# Patient Record
Sex: Male | Born: 1975 | Race: White | Hispanic: No | Marital: Married | Smoking: Current every day smoker
Health system: Southern US, Community
[De-identification: ages and names within clinical notes are randomized; demographics above are authoritative.]

## PROBLEM LIST (undated history)

## (undated) DIAGNOSIS — K469 Unspecified abdominal hernia without obstruction or gangrene: Secondary | ICD-10-CM

## (undated) DIAGNOSIS — Z86718 Personal history of other venous thrombosis and embolism: Secondary | ICD-10-CM

## (undated) DIAGNOSIS — Z8719 Personal history of other diseases of the digestive system: Secondary | ICD-10-CM

## (undated) DIAGNOSIS — E785 Hyperlipidemia, unspecified: Secondary | ICD-10-CM

## (undated) HISTORY — PX: MOUTH SURGERY: SHX715

## (undated) HISTORY — DX: Hyperlipidemia, unspecified: E78.5

## (undated) HISTORY — PX: HERNIA REPAIR: SHX51

---

## 2005-06-04 ENCOUNTER — Emergency Department (HOSPITAL_COMMUNITY): Admission: EM | Admit: 2005-06-04 | Discharge: 2005-06-04 | Payer: Self-pay | Admitting: Emergency Medicine

## 2008-02-01 ENCOUNTER — Emergency Department (HOSPITAL_COMMUNITY): Admission: EM | Admit: 2008-02-01 | Discharge: 2008-02-01 | Payer: Self-pay | Admitting: Family Medicine

## 2008-02-25 ENCOUNTER — Emergency Department (HOSPITAL_COMMUNITY): Admission: EM | Admit: 2008-02-25 | Discharge: 2008-02-25 | Payer: Self-pay | Admitting: Family Medicine

## 2008-03-03 ENCOUNTER — Emergency Department (HOSPITAL_COMMUNITY): Admission: EM | Admit: 2008-03-03 | Discharge: 2008-03-03 | Payer: Self-pay | Admitting: Emergency Medicine

## 2008-04-03 ENCOUNTER — Encounter: Admission: RE | Admit: 2008-04-03 | Discharge: 2008-04-03 | Payer: Self-pay | Admitting: Dentistry

## 2008-04-03 ENCOUNTER — Ambulatory Visit: Payer: Self-pay | Admitting: Dentistry

## 2008-04-06 ENCOUNTER — Ambulatory Visit (HOSPITAL_COMMUNITY): Admission: RE | Admit: 2008-04-06 | Discharge: 2008-04-06 | Payer: Self-pay | Admitting: Surgery

## 2008-04-13 ENCOUNTER — Ambulatory Visit (HOSPITAL_COMMUNITY): Admission: RE | Admit: 2008-04-13 | Discharge: 2008-04-13 | Payer: Self-pay | Admitting: Dentistry

## 2008-05-13 ENCOUNTER — Ambulatory Visit: Payer: Self-pay | Admitting: Pediatrics

## 2008-05-16 ENCOUNTER — Ambulatory Visit: Payer: Self-pay | Admitting: Dentistry

## 2008-07-11 ENCOUNTER — Ambulatory Visit: Payer: Self-pay | Admitting: Dentistry

## 2008-07-26 ENCOUNTER — Encounter: Admission: RE | Admit: 2008-07-26 | Discharge: 2008-07-26 | Payer: Self-pay | Admitting: Surgery

## 2008-11-19 ENCOUNTER — Emergency Department (HOSPITAL_COMMUNITY): Admission: EM | Admit: 2008-11-19 | Discharge: 2008-11-19 | Payer: Self-pay | Admitting: Emergency Medicine

## 2009-01-10 ENCOUNTER — Ambulatory Visit: Payer: Self-pay | Admitting: Dentistry

## 2009-08-07 ENCOUNTER — Ambulatory Visit: Payer: Self-pay | Admitting: Dentistry

## 2010-08-07 ENCOUNTER — Ambulatory Visit: Payer: Self-pay | Admitting: Dentistry

## 2010-10-28 ENCOUNTER — Emergency Department (HOSPITAL_COMMUNITY)
Admission: EM | Admit: 2010-10-28 | Discharge: 2010-10-28 | Payer: Self-pay | Source: Home / Self Care | Admitting: Emergency Medicine

## 2011-04-29 NOTE — Op Note (Signed)
NAME:  Zachary Rodriguez, Zachary Rodriguez                ACCOUNT NO.:  [id number]   MEDICAL RECORD NO.:  [id number]          PATIENT TYPE:  AMB   LOCATION:  DAY                          FACILITY:  [facility]   PHYSICIAN:  Wilmon Arms. Corliss Skains, M.D. DATE OF BIRTH:  03-06-1976   DATE OF PROCEDURE:  04/06/2008  DATE OF DISCHARGE:                               OPERATIVE REPORT   PREOPERATIVE DIAGNOSIS:  Tiny epigastric hernia.   POSTOPERATIVE DIAGNOSIS:  Tiny epigastric hernia.   PROCEDURE:  Epigastric hernia repair.   SURGEON:  Wilmon Arms. Corliss Skains, MD, FACS   ANESTHESIA:  General.   INDICATIONS:  The patient is a 35 year old male who presents with severe  epigastric pain with a palpable bulge.  This area is fairly tender.  The  patient also has problems with very poor dentition with multiple dental  abscesses.  He is scheduled for full dental extractions next week by Dr.  Kristin Bruins.  However, due to the severity of his pain, we made the  decision to proceed with the repair of this hernia today.  I felt that  he likely had a small amount of his falciform ligament which had  herniated up through a small defect.  We will not use mesh in light of  his dental condition.   DESCRIPTION OF PROCEDURE:  The patient was brought to the operating  room, placed in a supine position on the operating room table.  The  palpable bulge had previously been marked while the patient was awake.  After an adequate level of general anesthesia was obtained, his chest  and abdomen were prepped with Betadine and draped in a sterile fashion.  A time-out was taken to assure the proper patient, proper procedure.  We  palpated the area in question.  This seemed to be just below his xiphoid  process.  His xiphoid was quite prominent.  With the patient asleep, I  could not palpate a hernia bulge.  We made a vertical 2.5 cm incision  over the area that had previously been marked.  Dissection was carried  down to the fascia.  The fascia was opened  anteriorly.  We dissected  down and entered the peritoneal cavity sharply.  I inserted my finger  and swept the falciform ligament away from the anterior abdominal wall.  No other fascial defects were noted.  I was able to palpate down a  couple of inches towards his umbilicus, and no other midline defects  were noted.  Superiorly, his xiphoid process was easily palpated.  No  fractures were noted.  However, his xiphoid process was extremely  prominent.  We then infiltrated the tissue around the xiphoid process  with 0.25% Marcaine with epinephrine.  The fascia was then  reapproximated with figure-of-eight 0 Prolene sutures.  Vicryl 3-0 was  used to close the subcutaneous tissues over the Prolene sutures.  Monocryl 4-0 was used to close the skin incision.  Steri-Strips and  clean dressings were applied.  The patient was then extubated and  brought to recovery in stable condition.  All sponge, instrument, and  needle counts were correct.  Wilmon Arms. Tsuei, M.D.  Electronically Signed    MKT/MEDQ  D:  04/06/2008  T:  04/06/2008  Job:  [id number]

## 2011-04-29 NOTE — Consult Note (Signed)
NAME:  Zachary Rodriguez, Zachary Rodriguez                ACCOUNT NO.:  [id number]   MEDICAL RECORD NO.:  [id number]          PATIENT TYPE:  AMB   LOCATION:  DAY                          FACILITY:  [facility]   PHYSICIAN:  Charlynne Pander, D.D.S.DATE OF BIRTH:  [birth year]   DATE OF CONSULTATION:  04/03/2008  DATE OF DISCHARGE:                                 CONSULTATION   /REFERRING PHYSICIAN/>  Wilmon Arms. Tsuei, M.D.   [name]. Bagnall is a 35 year old male referred by Dr. [name] for  dental consultation.  Patient with history of abdominal pain and hernia  with anticipated hernia repair surgery with Dr. Corliss Skains scheduled later  this week.  Dental consultation was requested to rule out dental  infection that may affect the patient's systemic health.   MEDICAL HISTORY:  1. History of abdominal pain with hernia with anticipated surgery with      Dr. Corliss Skains on April 06, 2008.  2. History of hay fever/seasonal allergies.  3. Visual deficit - wears glasses.  4. History of severe dental phobia.   ALLERGIES:  None known.   MEDICATIONS:  1. Pepcid 10 mg daily.  2. Vicodin 5/500 one to two every 4 hours as needed for pain.  3. Boost supplement drinks 2 per day.   SOCIAL HISTORY:  The patient is married with 2 children.  Patient with a  history of smoking 1/2 to 1 pack per day for 17 years.  Patient with  rare use of alcohol.  The patient has no other illicit drug use.   FAMILY HISTORY:  Father had dentures at age of 22.  All other history is  noncontributory.   FUNCTIONAL ASSESSMENT:  The patient remains independent for ADLs.   REVIEW OF SYSTEMS:  This is reviewed with the patient and is included in  the dental consultation record.   DENTAL HISTORY:   CHIEF COMPLAINT:  Dental consultation requested to rule out dental  infection which may affect the patient's systemic health.   HISTORY OF PRESENT ILLNESS:  The patient was recently evaluated by Dr.  [name] for hernia repair surgery.  Poor  dentition noted at that  time.  Dental consultation was requested to rule out dental infection  that may affect the patient's systemic health.   The patient currently denies toothaches at this time although has a  history of intermittent tooth pain.  The patient points to multiple  teeth on the upper and lower arch as being involved.  The patient  indicates he last saw a dentist (Dr. [name]) for evaluation  for treatment options on October 14, 2007.  The patient was given  treatment plan for multiple extractions of remaining teeth and  subsequent upper and lower complete denture fabrication.  The patient  could not afford dental treatment at that time and has not been able to  follow up with this dental treatment.   The patient does give a history of severe dental phobia from the age of  38 when a cavity was restored without having sufficient local anesthesia  obtained.  The patient has only had three  other fillings since then in  his teenage years.   DENTAL EXAMINATION:  GENERAL:  The patient is well-developed, slightly  built male in no acute distress.  VITAL SIGNS:  Blood pressure is 120/58, pulse is equal to 68,  temperature is 98.2.  HEAD AND NECK:  There is no palpable lymphadenopathy noted at this time.  The patient denies acute TMJ symptoms.  INTRAORAL EXAM:  The patient has normal saliva.  The patient has a small  palatal torus near the vibrating line.  The patient also has bilateral  mandibular lingual tori.  The patient also has an exostoses involving  the upper right, upper left, lower left, and lower right quadrants near  the third molars.  There are no obvious abscesses noted at this time.  DENTITION:  The patient is not missing any teeth.  The patient, however,  has multiple retained root segments.  PERIODONTAL:  Patient with chronic advanced periodontal disease with  plaque and calculus accumulations, generalized gingival recession,  generalized tooth  mobility, and moderate to severe bone loss.  DENTAL CARIES:  The patient has rampant dental caries affecting all  teeth.  ENDODONTIC:  The patient with a history of previous toothache symptoms.  The patient denies toothaches today.  The patient does have multiple  teeth with periapical pathology.  CROWN OR BRIDGE:  There are no crown or bridge restorations.  PROSTHODONTIC:  The patient has no dentures but is interested in upper  and lower complete dentures without implants at this time.  OCCLUSION:  Patient with a poor occlusal scheme secondary to multiple  retained root segments, rampant dental caries, and supereruption and  drifting of the unopposed teeth into the edentulous areas.   RADIOGRAPHIC INTERPRETATION:  A panoramic x-ray was obtained today.   There are multiple areas of periapical pathology.  There are multiple  retained root segments.  There are rampant dental caries affecting all  remaining teeth.  There is a poor occlusal scheme noted.   ASSESSMENT:  1. History of dental phobia and reason for dental neglect.  2. History of acute pulpitis symptoms.  3. Multiple areas of periapical pathology.  4. Rampant dental caries.  5. Palatal torus.  6. Bilateral mandibular lingual tori.  7. Lateral exostoses involving all four quadrants of the mouth.  8. Poor occlusal scheme.  9. Significant risk for dental infection being able to affect the      patient's systemic health if left untreated.   PLAN/RECOMMENDATIONS:  1. I have discussed risks, benefits, and complications of various      treatment options with the patient in relationship to his medical      and dental conditions and anticipated hernia surgery.  We discussed      various treatment options to include no treatment, total and      subtotal extractions with alveoplasty, preprosthetic surgery as      indicated, periodontal therapy, dental restorations, crown or      bridge therapy, root canal therapy, implant therapy,  and replacing      missing teeth as indicated after adequate healing.  We discussed      timing of the dental surgery in relationship to his hernia repair      surgery.  The patient currently is interested in proceeding with      extraction of all remaining teeth with alveoloplasty and a      preprosthetic surgery approximately one week after the hernia      repair surgery on April 06, 2008.  The dental surgery is      tentatively scheduled for April 13, 2008 at [facility] at      7:30 in the morning pending any complications from the hernia      repair surgery with Dr. Corliss Skains.  The patient will then have adequate      time for healing and proceed with upper and lower complete denture      fabrication with a dentist of his choice.  The patient currently      again is not interested in implant therapy at this time but may be      interested in the future.  2. Discussion of findings with Dr. [name] and coordination of      future dental surgery on April 13, 2008 as indicated.      Charlynne Pander, D.D.S.  Electronically Signed     RFK/MEDQ  D:  04/03/2008  T:  04/03/2008  Job:  [id number]   cc:   Wilmon Arms. Corliss Skains, M.D.  [address] [place] [address]  [facility]   Charlynne Pander, D.D.S.  Fax: [phone]

## 2011-04-29 NOTE — Op Note (Signed)
NAME:  Zachary Rodriguez, Zachary Rodriguez                ACCOUNT NO.:  [id number]   MEDICAL RECORD NO.:  [id number]          PATIENT TYPE:  AMB   LOCATION:  DAY                          FACILITY:  [facility]   PHYSICIAN:  Charlynne Pander, D.D.S.DATE OF BIRTH:  08/02/1976   DATE OF PROCEDURE:  04/13/2008  DATE OF DISCHARGE:                               OPERATIVE REPORT   PREOPERATIVE DIAGNOSES:  1. History of abdominal pain - status post hernia repair surgery with      Dr. [name].  2. Dental phobia.  3. Chronic apical periodontitis.  4. Chronic periodontitis.  5. Multiple retained root segments.  6. Bilateral mandibular lingual tori.  7. Lateral exostoses involving all four quadrants.   POSTOPERATIVE DIAGNOSES:  1. History of abdominal pain - status post hernia repair surgery with      Dr. [name].  2. Dental phobia.  3. Chronic apical periodontitis.  4. Chronic periodontitis.  5. Multiple retained root segments.  6. Bilateral mandibular lingual tori.  7. Lateral exostoses involving all four quadrants.   OPERATIONS:  1. Extraction of remaining teeth (teeth numbers 1, 2, 3, 4, 5, 6, 7,      8, 9, 10, 11, 12, 13, 14, 15, 16, 17, 18, 19, 20, 21, 22, 23, 24,      25, 26, 27, 28, 29, 30, 31 and 32).  2. Four quadrants alveoloplasty.  3. Lateral exostosis reductions in the upper right, upper left, lower      left, lower right quadrants.  4. Bilateral mandibular lingual tori reductions.   SURGEON:  Charlynne Pander, D.D.S.   ASSISTANT:  Public house manager (Sales executive).   ANESTHESIA:  General anesthesia via nasoendotracheal tube.   MEDICATIONS:  1. Ancef 1 gram IV prior to invasive dental procedures.  2. Local anesthesia with a total utilization of five carpules each      containing 36 mg of Xylocaine with 0.018 mg of epinephrine as well      as two carpules each containing 9 mg of bupivacaine with 0.009 mg      of epinephrine.   SPECIMENS:  There were 32 teeth that were  discarded.   CULTURES:  Were none.   DRAINS:  Were none.   ESTIMATED BLOOD LOSS:  Was 150 mL.   FLUIDS:  Were 1400 mL lactated Ringer's solution.   COMPLICATIONS:  There was a soft tissue tear in the lower right lingual  vestibule.  This was closed with two separate 4-0 chromic interrupted  sutures.   INDICATIONS:  The patient was recently diagnosed with abdominal pain and  needed a hernia repair surgery.  This was performed by Dr. [name]  on April 06, 2008.  Dental consultation was requested by Dr. Corliss Skains to  rule out dental infection which may affect the patient's systemic  health.  The patient was examined and treatment planned for extraction  of all remaining teeth with alveoloplasty and pre-prosthetic surgery as  indicated.  This treatment plan was formulated to decrease the risk and  complications associated with dental infection from affecting the  patient's systemic health.  OPERATIVE FINDINGS:  The patient was examined in operating room #6.  The  teeth were identified for extraction.  The patient was noted to be  affected by chronic periodontitis, chronic apical periodontitis,  multiple retained root segments, dental caries, lateral exostoses and  bilateral mandibular lingual tori.  The aforementioned necessitated the  removal of all remaining teeth with alveoloplasty and pre prosthetic  surgery as indicated..   DESCRIPTION OF PROCEDURE:  The patient was brought to the main operating  room #6.  The patient was then placed in the supine position on the  operating room table.  The patient was then intubated utilizing a  nasoendotracheal tube.  The patient was then prepped and draped in the  usual manner for a dental medicine procedure.  A time-out was performed  and the patient was identified and procedures verified.  A throat pack  was placed at this time.  The oral cavity was then thoroughly examined  with the findings as noted above.  The patient was then ready  for the  dental medicine procedure as follows:   Local anesthesia was administered sequentially with a total utilization  of five carpules each containing 36 mg of Xylocaine with 0.018 mg of  epinephrine as well as two carpules each containing 9 mg of bupivacaine  with 0.009 mg of epinephrine.  This local anesthesia was administered  sequentially over the 3 hour long procedure.   The maxillary quadrants were first approached.  Anesthesia was delivered  with infiltration utilizing the Xylocaine with epinephrine.  A 15 blade  incision was then made from the maxillary right tuberosity and extended  to the maxillary left tuberosity.  A surgical flap was then reflected  appropriately.  Appropriate amounts of buccal and interseptal bone was  removed at this time utilizing a surgical handpiece and bur and copious  amounts of sterile saline.  The teeth were then subluxated with a series  of straight elevators.  Tooth number 1 was then removed with a 150  forceps without complications.  Tooth numbers 2 and 3 were then removed  with a 53R forceps without complications.  Tooth numbers 4, 5, 6, 7, 8,  9, 10, 11, 12 and 13 were then removed with a 150 forceps without  complications.  Tooth numbers 14 and 15 were then removed with a 53L  forceps without complications.  Tooth number 16 was then removed with  rongeurs appropriately.  At this point in time alveoloplasty was  performed utilizing rongeurs and bone file.  The buccal exostoses in the  upper right and upper left quadrants were then visualized and removed  with a surgical handpiece and bur and copious amounts of sterile saline.  The surgical sites were then irrigated with copious amounts of sterile  saline.  The tissues were approximated and trimmed appropriately.  Further alveoplasty was then performed utilizing rongeurs and bone file.  The surgical sites were then irrigated with copious amounts of sterile  saline x2.  At this point in time  the maxillary right surgical site was  closed from the distal of the tuberosity and extended to the mesial of  number 8 utilizing 3-0 chromic gut suture in a continuous interrupted  suture technique x1.  The maxillary left quadrant was then closed from  the distal of the tuberosity and extended to the mesial of number 9  utilizing 3-0 chromic gut suture in a continuous interrupted suture  technique x1.   At this point in time the mandibular quadrants  were approached.  The  patient was given bilateral inferior alveolar nerve blocks utilizing the  bupivacaine with epinephrine.  Further infiltration was then achieved  utilizing Xylocaine with epinephrine.  A 15 blade incision was then made  from the distal of number 32 and extended to the mesial of number 22.  Surgical flap was then carefully reflected.  Appropriate amounts of  buccal and interseptal bone were removed around tooth numbers 27, 28,  29, 30 and 31 appropriately.  The lower teeth were then subluxated with  a series of straight elevators.  Tooth numbers 30, 31 and 32 were then  removed with a 17 forceps without complications.  Tooth numbers 23, 24,  25, 26, 27, 28 and 29 were then removed with a 151 forceps without  further complications.  Alveoloplasty was then performed utilizing  rongeurs and bone file.  At this point in time the mandibular lingual  tori were visualized and removed with a surgical handpiece and bur and  copious amounts of sterile saline.  The flap was further reflected in  the area distal to tooth number 32 and lateral exostoses were noted and  then removed with a surgical handpiece and bur and copious amounts of  sterile saline.  Alveoplasty was then again performed utilizing rongeurs  and bone file appropriately.  The surgical sites were then irrigated  with copious amounts of sterile saline x2.  Tissues were approximated  and trimmed appropriately.  The surgical site was then closed from the  distal of  number 32 and extended to the mesial of number 25 utilizing 3-  0 chromic gut suture in a continuous interrupted suture technique x1.   At this point in time the mandibular left quadrant was approached.  A 15  blade incision was then made from distal of number 17 and extended to  the mesial of number 22.  Surgical flap was then further reflected.  Appropriate amounts of buccal and interseptal bone were removed with  surgical handpiece and bur and copious amounts of sterile saline around  tooth numbers 17, 18, 19, 20, 21 and 22.  These teeth were then  subluxated with a series of straight elevators.  Tooth number 17 was  then removed with a 17 forceps without complications.  Tooth number 18  was removed with a 151 forceps.  Tooth number 19 was then removed with a  23 forceps without further complications.  Tooth numbers 20, 21 and 22  were then removed with a 151 forceps without complications.  At this  point in time alveoloplasty was performed utilizing rongeurs and bone  file.  The flap was then further reflected on the lingual aspect to  expose the mandibular tori.  These were then removed with a surgical  handpiece and bur and copious amounts of sterile saline.  The flap was  then further reflected distal of tooth number 17 to expose the excess  lateral exostoses in this area.  These exostoses were then removed with  a surgical handpiece and bur and copious amounts of sterile saline.  Further alveoplasty was then performed utilizing rongeurs and bone file.  The surgical site was then irrigated with copious amounts of sterile  saline x2.  The surgical site was then closed from the distal of number  17 and extended to the mesial of number 24 utilizing 3-0 chromic gut  suture in a continuous interrupted suture technique x1.  At this point  in time the entire mouth was irrigated with copious amounts  of sterile  saline.  A soft tissue tear was noted on the lingual vestibule in the  area of  teeth numbers 20 and 29.  This 8 mm soft tissue tear was then  closed utilizing two separate interrupted sutures and 4-0 chromic gut  material.  The patient was then examined and no further complications  were noted.   At this point in time the throat pack was removed.  A series of 4x4  gauzes were placed in the mouth to aid in hemostasis.  The patient was  then handed over to the anesthesia team for final disposition.  After  appropriate amount of time the patient was extubated and taken to the  post anesthesia care unit with stable vital signs and a good oxygenation  level.  All counts were correct for the dental medicine procedure.  The  patient will be given appropriate pain medication utilizing Percocet  5/325 taking 1-2 tablets as needed every 4-6 hours.  The patient is  aware of the  need to avoid driving while on this medication as well as to avoid  alcohol.  The patient also will be given a prescription for amoxicillin  500 mg taking 1 capsule every eight hours for the next 7 days.  The  patient is to contact dental medicine if acute problems arise as a  postop complication.      Charlynne Pander, D.D.S.  Electronically Signed     RFK/MEDQ  D:  04/13/2008  T:  04/13/2008  Job:  [id number]   cc:   Wilmon Arms. Corliss Skains, M.D.  [address] [place] [address] [id number]  [place]

## 2011-08-13 ENCOUNTER — Encounter (HOSPITAL_COMMUNITY): Payer: Self-pay | Admitting: Dentistry

## 2011-08-13 DIAGNOSIS — K08109 Complete loss of teeth, unspecified cause, unspecified class: Secondary | ICD-10-CM

## 2011-08-13 DIAGNOSIS — F40298 Other specified phobia: Secondary | ICD-10-CM

## 2011-09-05 LAB — I-STAT 8, (EC8 V) (CONVERTED LAB)
Bicarbonate: 25.2 — ABNORMAL HIGH
HCT: 58 — ABNORMAL HIGH
Hemoglobin: 19.7 — ABNORMAL HIGH
Operator id: 116391
Potassium: 4.9
Sodium: 138
TCO2: 26

## 2011-09-05 LAB — STOOL CULTURE

## 2011-09-08 LAB — URINALYSIS, ROUTINE W REFLEX MICROSCOPIC
Glucose, UA: NEGATIVE
Protein, ur: NEGATIVE
Specific Gravity, Urine: 1.021

## 2011-09-08 LAB — COMPREHENSIVE METABOLIC PANEL
Alkaline Phosphatase: 91
BUN: 15
Calcium: 9.9
Creatinine, Ser: 1.05
Glucose, Bld: 101 — ABNORMAL HIGH
Total Protein: 7.7

## 2011-09-08 LAB — DIFFERENTIAL
Basophils Relative: 1
Lymphocytes Relative: 39
Lymphs Abs: 2.9
Monocytes Relative: 11
Neutro Abs: 3.3
Neutrophils Relative %: 45

## 2011-09-08 LAB — CBC
HCT: 52.1 — ABNORMAL HIGH
Hemoglobin: 17.4 — ABNORMAL HIGH
MCHC: 33.4
MCV: 82.8
Platelets: 198
RDW: 13.8

## 2011-09-09 LAB — BASIC METABOLIC PANEL WITH GFR
BUN: 14
CO2: 26
Calcium: 9.9
Chloride: 104
Creatinine, Ser: 0.89
GFR calc non Af Amer: >60
Glucose, Bld: 98
Potassium: 3.9
Sodium: 138

## 2011-09-09 LAB — PROTIME-INR
INR: 1
Prothrombin Time: 13

## 2011-09-09 LAB — CBC
RBC: 6.22 — ABNORMAL HIGH
WBC: 8.6

## 2011-09-09 LAB — APTT: aPTT: 29

## 2011-09-19 LAB — POCT CARDIAC MARKERS
CKMB, poc: <1 ng/mL — ABNORMAL LOW (ref 1.0–8.0)
Troponin i, poc: <0.05 ng/mL (ref 0.00–0.09)

## 2011-09-19 LAB — DIFFERENTIAL
Basophils Absolute: 0 10*3/uL (ref 0.0–0.1)
Lymphocytes Relative: 19 % (ref 12–46)
Monocytes Absolute: 0.6 10*3/uL (ref 0.1–1.0)
Monocytes Relative: 6 % (ref 3–12)
Neutro Abs: 7.4 10*3/uL (ref 1.7–7.7)

## 2011-09-19 LAB — POCT I-STAT, CHEM 8
HCT: 50 % (ref 39.0–52.0)
Hemoglobin: 17 g/dL (ref 13.0–17.0)
Potassium: 3.3 mEq/L — ABNORMAL LOW (ref 3.5–5.1)
Sodium: 143 mEq/L (ref 135–145)

## 2011-09-19 LAB — CBC
Hemoglobin: 16.2 g/dL (ref 13.0–17.0)
RBC: 5.72 MIL/uL (ref 4.22–5.81)

## 2012-04-18 ENCOUNTER — Encounter (HOSPITAL_COMMUNITY): Payer: Self-pay

## 2012-04-18 ENCOUNTER — Emergency Department (HOSPITAL_COMMUNITY)
Admission: EM | Admit: 2012-04-18 | Discharge: 2012-04-19 | Disposition: A | Discharge status: Home / Self Care | Payer: Self-pay | Attending: Emergency Medicine | Admitting: Emergency Medicine

## 2012-04-18 DIAGNOSIS — F17200 Nicotine dependence, unspecified, uncomplicated: Secondary | ICD-10-CM | POA: Insufficient documentation

## 2012-04-18 DIAGNOSIS — R109 Unspecified abdominal pain: Secondary | ICD-10-CM | POA: Insufficient documentation

## 2012-04-18 DIAGNOSIS — R112 Nausea with vomiting, unspecified: Secondary | ICD-10-CM | POA: Insufficient documentation

## 2012-04-18 DIAGNOSIS — R197 Diarrhea, unspecified: Secondary | ICD-10-CM | POA: Insufficient documentation

## 2012-04-18 MED ORDER — DIPHENOXYLATE-ATROPINE 2.5-0.025 MG PO TABS
2.0000 | ORAL_TABLET | Freq: Once | ORAL | Status: DC
  Filled 2012-04-18: qty 2

## 2012-04-18 MED ORDER — ONDANSETRON HCL 4 MG/2ML IJ SOLN
4.0000 mg | Freq: Once | INTRAMUSCULAR | Status: AC
  Administered 2012-04-18: 4 mg via INTRAVENOUS
  Filled 2012-04-18: qty 2

## 2012-04-18 MED ORDER — MORPHINE SULFATE 2 MG/ML IJ SOLN
2.0000 mg | Freq: Once | INTRAMUSCULAR | Status: AC
  Administered 2012-04-18: 2 mg via INTRAVENOUS
  Filled 2012-04-18: qty 1

## 2012-04-18 MED ORDER — SODIUM CHLORIDE 0.9 % IV SOLN
Freq: Once | INTRAVENOUS | Status: AC
  Administered 2012-04-18: via INTRAVENOUS

## 2012-04-18 MED ORDER — SODIUM CHLORIDE 0.9 % IV BOLUS (SEPSIS)
1000.0000 mL | Freq: Once | INTRAVENOUS | Status: AC
  Administered 2012-04-18: 1000 mL via INTRAVENOUS

## 2012-04-18 NOTE — ED Provider Notes (Signed)
History     CSN: [id number]  Arrival date & time 04/18/12  2337   First MD Initiated Contact with Patient 04/18/12 2339      Chief Complaint  Patient presents with  . Abdominal Pain  . Emesis    (Consider location/radiation/quality/duration/timing/severity/associated sxs/prior treatment) HPI Zachary Rodriguez is a 36 y.o. male who presents to the Emergency Department complaining of sudden onset  nausea, vomiting, dairrhea and abdominal cramping and pain that began after eating tacos tonight. No one else in the family is having similar symptoms. Denies fever, chills.     History reviewed. No pertinent past medical history.  History reviewed. No pertinent past surgical history.  No family history on file.  History  Substance Use Topics  . Smoking status: Current Everyday Smoker  . Smokeless tobacco: Not on file  . Alcohol Use: No      Review of Systems  Constitutional: Negative for fever.       10 Systems reviewed and are negative for acute change except as noted in the HPI.  HENT: Negative for congestion.   Eyes: Negative for discharge and redness.  Respiratory: Negative for cough and shortness of breath.   Cardiovascular: Negative for chest pain.  Gastrointestinal: Positive for nausea, vomiting, abdominal pain and diarrhea.  Musculoskeletal: Negative for back pain.  Skin: Negative for rash.  Neurological: Negative for syncope, numbness and headaches.  Psychiatric/Behavioral:       No behavior change.    Allergies  Review of patient's allergies indicates no known allergies.  Home Medications  No current outpatient prescriptions on file.  BP 174/98  Pulse 76  Temp(Src) 98.1 F (36.7 C) (Oral)  Resp 20  Ht 5\' 9"  (1.753 m)  Wt 115 lb (52.164 kg)  BMI 16.98 kg/m2  SpO2 100%  Physical Exam  Nursing note and vitals reviewed. Constitutional:       Awake, alert, nontoxic appearance.  HENT:  Head: Atraumatic.  Eyes: Right eye exhibits no discharge. Left eye  exhibits no discharge.  Neck: Neck supple.  Pulmonary/Chest: Effort normal. He exhibits no tenderness.  Abdominal: Soft. There is tenderness. There is no rebound and no guarding.       Bowel sounds hyperactive  Musculoskeletal: He exhibits no tenderness.       Baseline ROM, no obvious new focal weakness.  Neurological:       Mental status and motor strength appears baseline for patient and situation.  Skin: No rash noted.  Psychiatric: He has a normal mood and affect.    ED Course  Procedures (including critical care time) Dg Abd Acute W/chest  04/19/2012  *RADIOLOGY REPORT*  Clinical Data: Severe right-sided abdominal pain, nausea and vomiting.  ACUTE ABDOMEN SERIES (ABDOMEN 2 VIEW & CHEST 1 VIEW)  Comparison: CT of the abdomen and pelvis performed 07/26/2008, and chest radiograph performed 11/19/2008  Findings: The lungs are well-aerated.  Mild peribronchial thickening is noted.  There is no evidence of focal opacification, pleural effusion or pneumothorax.  The cardiomediastinal silhouette is within normal limits.  Increased density of the right hemithorax is thought to reflect overlying soft tissues.  The liver appears mildly enlarged, measuring 21.9 cm in length; this appears mildly more prominent than in 2009.  The visualized bowel gas pattern is unremarkable.  Mild scattered stool and air are seen within the colon; there is no evidence of small bowel dilatation to suggest obstruction.  No free intra- abdominal air is identified on the provided upright view.  No acute osseous  abnormalities are seen; the sacroiliac joints are unremarkable in appearance.  IMPRESSION:  1.  Unremarkable bowel gas pattern; no free intra-abdominal air seen. 2.  No acute cardiopulmonary process identified. 3.  Apparent mild hepatomegaly.  Original Report Authenticated By: Tonia Ghent, M.D.      MDM  Patient with nausea vomiting diarrhea illness that began after eating is tonight. Given IV fluids, antiemetic and  analgesic, and antidiarrheal with improvement. Able to take by mouth fluids. Formed stool while in the ER.Pt stable in ED with no significant deterioration in condition.The patient appears reasonably screened and/or stabilized for discharge and I doubt any other medical condition or other [facility] requiring further screening, evaluation, or treatment in the ED at this time prior to discharge.  MDM Reviewed: nursing note and vitals Interpretation: x-ray          Nicoletta Dress. Colon Branch, MD 04/19/12 4098

## 2012-04-18 NOTE — ED Notes (Signed)
abd pain, vomiting and diarrhea onset after eating taco's tonight, states has had intermittent abd pain since hernia repair surgery several years ago

## 2012-04-19 ENCOUNTER — Emergency Department (HOSPITAL_COMMUNITY): Payer: Self-pay

## 2012-04-19 MED ORDER — PROMETHAZINE HCL 25 MG/ML IJ SOLN
12.5000 mg | Freq: Once | INTRAMUSCULAR | Status: AC
  Administered 2012-04-19: 12.5 mg via INTRAVENOUS
  Filled 2012-04-19: qty 1

## 2012-04-19 MED ORDER — PROMETHAZINE HCL 25 MG PO TABS: 12.5000 mg | ORAL_TABLET | Freq: Four times a day (QID) | ORAL | Status: DC | PRN

## 2012-04-19 MED ORDER — MORPHINE SULFATE 2 MG/ML IJ SOLN
2.0000 mg | Freq: Once | INTRAMUSCULAR | Status: AC
  Administered 2012-04-19: 2 mg via INTRAVENOUS
  Filled 2012-04-19: qty 1

## 2012-04-19 NOTE — Discharge Instructions (Signed)
Your x-rays tonight did not show any blockage in the intestines.Bland diet for the next 8-10 hours then progress as you can tolerate. Use the nausea medicine as needed.Follow up with your doctor.  Abdominal Pain Many things can cause belly (abdominal) pain. Most times, the belly pain is not dangerous. The amount of belly pain does not tell how serious the problem may be. Many cases of belly pain can be watched and treated at home. HOME CARE   Do not take medicines that help you go poop (laxatives) unless told to by your doctor.   Only take medicine as told by your doctor.   Eat or drink as told by your doctor. Your doctor will tell you if you should be on a special diet.  GET HELP RIGHT AWAY IF:   The pain does not go away.   You have a fever.   You keep throwing up (vomiting).   The pain changes and is only in the right or left part of the belly.   You have bloody or tarry looking poop.  MAKE SURE YOU:   Understand these instructions.   Will watch your condition.   Will get help right away if you are not doing well or get worse.  Document Released: 05/19/2008 Document Revised: 11/20/2011 Document Reviewed: 12/17/2009 [facility] Patient Information 2012 [place], [place].

## 2012-04-19 NOTE — ED Notes (Signed)
Notified dr. Colon Branch that patient is requesting more pain medication.

## 2012-04-19 NOTE — ED Notes (Signed)
Was removing patient's iv by rubbing alcohol on tape prior to pulling so tape wouldn't pull hair and skin as hard. Patient stated to go ahead and pull tape off fast so it wouldn't take as long and pull as bad. Pulled tape off as requested by patient. Patient stated that it hurt and was talking to wife stating "we know more about this profession than most people." Voicing that he was upset with how ems taped his iv to his arm.

## 2012-04-20 ENCOUNTER — Encounter (HOSPITAL_COMMUNITY): Payer: Self-pay

## 2012-04-20 ENCOUNTER — Emergency Department (HOSPITAL_COMMUNITY): Payer: Self-pay

## 2012-04-20 ENCOUNTER — Emergency Department (HOSPITAL_COMMUNITY)
Admission: EM | Admit: 2012-04-20 | Discharge: 2012-04-20 | Disposition: A | Discharge status: Home / Self Care | Payer: Self-pay | Attending: Emergency Medicine | Admitting: Emergency Medicine

## 2012-04-20 DIAGNOSIS — R6883 Chills (without fever): Secondary | ICD-10-CM | POA: Insufficient documentation

## 2012-04-20 DIAGNOSIS — R109 Unspecified abdominal pain: Secondary | ICD-10-CM | POA: Insufficient documentation

## 2012-04-20 DIAGNOSIS — K921 Melena: Secondary | ICD-10-CM | POA: Insufficient documentation

## 2012-04-20 DIAGNOSIS — R112 Nausea with vomiting, unspecified: Secondary | ICD-10-CM | POA: Insufficient documentation

## 2012-04-20 DIAGNOSIS — F419 Anxiety disorder, unspecified: Secondary | ICD-10-CM

## 2012-04-20 DIAGNOSIS — F411 Generalized anxiety disorder: Secondary | ICD-10-CM | POA: Insufficient documentation

## 2012-04-20 HISTORY — DX: Unspecified abdominal hernia without obstruction or gangrene: K46.9

## 2012-04-20 LAB — COMPREHENSIVE METABOLIC PANEL
ALT: 16 U/L (ref 0–53)
AST: 16 U/L (ref 0–37)
Alkaline Phosphatase: 84 U/L (ref 39–117)
CO2: 22 mEq/L (ref 19–32)
Chloride: 104 mEq/L (ref 96–112)
GFR calc Af Amer: >90 mL/min (ref >90)
GFR calc non Af Amer: >90 mL/min (ref >90)
Glucose, Bld: 108 mg/dL — ABNORMAL HIGH (ref 70–99)
Sodium: 142 mEq/L (ref 135–145)
Total Bilirubin: 0.5 mg/dL (ref 0.3–1.2)

## 2012-04-20 LAB — CBC
HCT: 46.6 % (ref 39.0–52.0)
MCV: 81.2 fL (ref 78.0–100.0)
Platelets: 260 10*3/uL (ref 150–400)
RBC: 5.74 MIL/uL (ref 4.22–5.81)
RDW: 14.2 % (ref 11.5–15.5)
WBC: 13.8 10*3/uL — ABNORMAL HIGH (ref 4.0–10.5)

## 2012-04-20 LAB — DIFFERENTIAL
Basophils Absolute: 0 10*3/uL (ref 0.0–0.1)
Lymphocytes Relative: 15 % (ref 12–46)
Lymphs Abs: 2 10*3/uL (ref 0.7–4.0)
Neutro Abs: 10.7 10*3/uL — ABNORMAL HIGH (ref 1.7–7.7)

## 2012-04-20 LAB — URINALYSIS, ROUTINE W REFLEX MICROSCOPIC
Bilirubin Urine: NEGATIVE
Hgb urine dipstick: NEGATIVE
Protein, ur: NEGATIVE mg/dL
Urobilinogen, UA: 0.2 mg/dL (ref 0.0–1.0)

## 2012-04-20 LAB — OCCULT BLOOD, POC DEVICE: Fecal Occult Bld: POSITIVE

## 2012-04-20 MED ORDER — HYDROMORPHONE HCL PF 1 MG/ML IJ SOLN
1.0000 mg | Freq: Once | INTRAMUSCULAR | Status: AC
  Administered 2012-04-20: 1 mg via INTRAVENOUS
  Filled 2012-04-20: qty 1

## 2012-04-20 MED ORDER — LORAZEPAM 1 MG PO TABS: 1.0000 mg | ORAL_TABLET | Freq: Three times a day (TID) | ORAL | Status: AC | PRN

## 2012-04-20 MED ORDER — IOHEXOL 300 MG/ML  SOLN
100.0000 mL | Freq: Once | INTRAMUSCULAR | Status: AC | PRN
  Administered 2012-04-20: 100 mL via INTRAVENOUS

## 2012-04-20 MED ORDER — OXYCODONE-ACETAMINOPHEN 5-325 MG PO TABS: 1.0000 | ORAL_TABLET | ORAL | Status: AC | PRN

## 2012-04-20 MED ORDER — ONDANSETRON HCL 4 MG/2ML IJ SOLN
4.0000 mg | Freq: Once | INTRAMUSCULAR | Status: AC
  Administered 2012-04-20: 4 mg via INTRAVENOUS
  Filled 2012-04-20: qty 2

## 2012-04-20 MED ORDER — SODIUM CHLORIDE 0.9 % IV SOLN
1000.0000 mL | INTRAVENOUS | Status: DC
  Administered 2012-04-20: 1000 mL via INTRAVENOUS

## 2012-04-20 NOTE — Discharge Instructions (Signed)
Call the GI doctor for appointment to be seen to be evaluated for possible colonoscopy.  Use the resource guide to find a primary care doctor See the Dr. of your choice for ongoing care.    Abdominal Pain Abdominal pain can be caused by many things. Your caregiver decides the seriousness of your pain by an examination and possibly blood tests and X-rays. Many cases can be observed and treated at home. Most abdominal pain is not caused by a disease and will probably improve without treatment. However, in many cases, more time must pass before a clear cause of the pain can be found. Before that point, it may not be known if you need more testing, or if hospitalization or surgery is needed. HOME CARE INSTRUCTIONS   Do not take laxatives unless directed by your caregiver.   Take pain medicine only as directed by your caregiver.   Only take over-the-counter or prescription medicines for pain, discomfort, or fever as directed by your caregiver.   Try a clear liquid diet (broth, tea, or water) for as long as directed by your caregiver. Slowly move to a bland diet as tolerated.  SEEK IMMEDIATE MEDICAL CARE IF:   The pain does not go away.   You have a fever.   You keep throwing up (vomiting).   The pain is felt only in portions of the abdomen. Pain in the right side could possibly be appendicitis. In an adult, pain in the left lower portion of the abdomen could be colitis or diverticulitis.   You pass bloody or black tarry stools.  MAKE SURE YOU:   Understand these instructions.   Will watch your condition.   Will get help right away if you are not doing well or get worse.  Document Released: 09/10/2005 Document Revised: 11/20/2011 Document Reviewed: 07/19/2008 [facility] Patient Information 2012 [place], [place].Anxiety and Panic Attacks Your caregiver has informed you that you are having an anxiety or panic attack. There may be many forms of this. Most of the time these attacks come  suddenly and without warning. They come at any time of day, including periods of sleep, and at any time of life. They may be strong and unexplained. Although panic attacks are very scary, they are physically harmless. Sometimes the cause of your anxiety is not known. Anxiety is a protective mechanism of the body in its fight or flight mechanism. Most of these perceived danger situations are actually nonphysical situations (such as anxiety over losing a job). CAUSES  The causes of an anxiety or panic attack are many. Panic attacks may occur in otherwise healthy people given a certain set of circumstances. There may be a genetic cause for panic attacks. Some medications may also have anxiety as a side effect. SYMPTOMS  Some of the most common feelings are:  Intense terror.   Dizziness, feeling faint.   Hot and cold flashes.   Fear of going crazy.   Feelings that nothing is real.   Sweating.   Shaking.   Chest pain or a fast heartbeat (palpitations).   Smothering, choking sensations.   Feelings of impending doom and that death is near.   Tingling of extremities, this may be from over-breathing.   Altered reality (derealization).   Being detached from yourself (depersonalization).  Several symptoms can be present to make up anxiety or panic attacks. DIAGNOSIS  The evaluation by your caregiver will depend on the type of symptoms you are experiencing. The diagnosis of anxiety or panic attack is made  when no physical illness can be determined to be a cause of the symptoms. TREATMENT  Treatment to prevent anxiety and panic attacks may include:  Avoidance of circumstances that cause anxiety.   Reassurance and relaxation.   Regular exercise.   Relaxation therapies, such as yoga.   Psychotherapy with a psychiatrist or therapist.   Avoidance of caffeine, alcohol and illegal drugs.   Prescribed medication.  SEEK IMMEDIATE MEDICAL CARE IF:   You experience panic attack symptoms  that are different than your usual symptoms.   You have any worsening or concerning symptoms.  Document Released: 12/01/2005 Document Revised: 11/20/2011 Document Reviewed: 04/04/2010 [facility] Patient Information 2012 [place], [place].    RESOURCE GUIDE  Dental Problems  Patients with Medicaid: [facility] [phone] [address]                                           [phone] [address] Phone:  [phone]                                                  Phone:  [phone]  If unable to pay or uninsured, contact:  [facility] or [facility]. to become qualified for the adult dental clinic.  Chronic Pain Problems Contact [facility]  [phone] Patients need to be referred by their primary care doctor.  Insufficient Money for Medicine Contact [employer]:  call "211" or [facility] [phone].  No Primary Care Doctor Call [facility]  [phone] Other agencies that provide inexpensive medical care    [facility]  [phone]    [facility]  [phone]    [facility]  [phone]    [facility]  [phone]    [facility]  [phone]    [facility]  [phone]  Psychological Services [facility] Behavioral Health  [phone] [facility] Services  [phone] [facility] Mental Health   [phone] (emergency services [phone])  Substance Abuse Resources Alcohol and Drug Services  [phone] [facility] ([phone] [facility] [phone] Floydene Flock [phone] Residential & Outpatient Substance Abuse Program  [phone]  Abuse/Neglect [facility] Child Abuse Hotline 208-548-9797 [facility] Child Abuse Hotline (740)657-1166 (After Hours)  Emergency Shelter [facility] Ministries [phone]  Maternity Homes [facility] [phone] [facility] 2063451247  MRSA Hotline #:   817-886-6406    [facility] Resources  [facility]     [employer]                          [facility] Dept. [address]                        [address]      [address]  [address]  Cristobal Goldmann Phone:  [phone]                                   Phone:  [phone]                 Phone:  [phone]  [facility] Mental Health Phone:  [phone]  [facility] Child Abuse Hotline [phone] 498-1863 [phone] (After Hours)

## 2012-04-20 NOTE — ED Provider Notes (Signed)
History     CSN: [id number]  Arrival date & time 04/20/12  1123   First MD Initiated Contact with Patient 04/20/12 1202      Chief Complaint  Patient presents with  . Abdominal Pain  . Rectal Bleeding    (Consider location/radiation/quality/duration/timing/severity/associated sxs/prior treatment) HPI Comments: Zachary Rodriguez is a 36 y.o. Male who complains of abdominal pain for 3 days associated with bright red blood per rectum, nausea, vomiting, and chills. He was evaluated 3 days ago, had an abdominal x-ray done and was discharged. He has not previously had blood per rectum. He has been anorexic for 3-1/2 days. He has no back pain, leg pain, weakness, or dizziness.  Patient is a 36 y.o. male presenting with abdominal pain and hematochezia. The history is provided by the patient.  Abdominal Pain The primary symptoms of the illness include abdominal pain and hematochezia.  Rectal Bleeding  Associated symptoms include abdominal pain.    Past Medical History  Diagnosis Date  . Hernia   . Abdominal hernia     Past Surgical History  Procedure Date  . Hernia repair     No family history on file.  History  Substance Use Topics  . Smoking status: Current Everyday Smoker  . Smokeless tobacco: Not on file  . Alcohol Use: No      Review of Systems  Gastrointestinal: Positive for abdominal pain and hematochezia.  All other systems reviewed and are negative.    Allergies  Review of patient's allergies indicates no known allergies.  Home Medications   Current Outpatient Rx  Name Route Sig Dispense Refill  . ASPIRIN 81 MG PO CHEW Oral Chew 81 mg by mouth daily.    Marland Kitchen HYDROCODONE-ACETAMINOPHEN 5-500 MG PO TABS Oral Take 1 tablet by mouth every 6 (six) hours as needed. For pain. His wifes prescription    . LANSOPRAZOLE 15 MG PO CPDR Oral Take 30 mg by mouth daily.    Marland Kitchen PROMETHAZINE HCL 25 MG PO TABS Oral Take 0.5 tablets (12.5 mg total) by mouth every 6 (six) hours as  needed for nausea. 10 tablet 0  . LORAZEPAM 1 MG PO TABS Oral Take 1 tablet (1 mg total) by mouth 3 (three) times daily as needed for anxiety. 30 tablet 0  . OXYCODONE-ACETAMINOPHEN 5-325 MG PO TABS Oral Take 1 tablet by mouth every 4 (four) hours as needed for pain. 30 tablet 0    BP 147/81  Pulse 97  Temp(Src) 98.9 F (37.2 C) (Oral)  Resp 20  SpO2 100%  Physical Exam  Nursing note and vitals reviewed. Constitutional: He is oriented to person, place, and time. He appears well-developed and well-nourished.  HENT:  Head: Normocephalic and atraumatic.  Right Ear: External ear normal.  Left Ear: External ear normal.  Eyes: Conjunctivae and EOM are normal. Pupils are equal, round, and reactive to light.  Neck: Normal range of motion and phonation normal. Neck supple.  Cardiovascular: Normal rate, regular rhythm, normal heart sounds and intact distal pulses.   Pulmonary/Chest: Effort normal and breath sounds normal. He exhibits no bony tenderness.  Abdominal: Soft. Normal appearance. He exhibits no distension and no mass. There is tenderness (Moderate right lower abdomen). There is no guarding.  Genitourinary: Rectum normal.       Brown stool in rectum, no mass, no external hemorrhoid, fissuring or rash  Musculoskeletal: Normal range of motion.  Neurological: He is alert and oriented to person, place, and time. He has normal strength. No  cranial nerve deficit or sensory deficit. He exhibits normal muscle tone. Coordination normal.  Skin: Skin is warm, dry and intact.  Psychiatric: He has a normal mood and affect. His behavior is normal. Judgment and thought content normal.    ED Course  Procedures (including critical care time)  Labs Reviewed  CBC - Abnormal; Notable for the following:    WBC 13.8 (*)    All other components within normal limits  DIFFERENTIAL - Abnormal; Notable for the following:    Neutrophils Relative 78 (*)    Neutro Abs 10.7 (*)    All other components  within normal limits  COMPREHENSIVE METABOLIC PANEL - Abnormal; Notable for the following:    Potassium 3.3 (*)    Glucose, Bld 108 (*)    All other components within normal limits  URINALYSIS, ROUTINE W REFLEX MICROSCOPIC - Abnormal; Notable for the following:    pH 8.5 (*)    Ketones, ur TRACE (*)    All other components within normal limits  LACTIC ACID, PLASMA  OCCULT BLOOD, POC DEVICE   Ct Abdomen Pelvis W Contrast  04/20/2012  *RADIOLOGY REPORT*  Clinical Data: Abdominal pain and rectal bleeding.  CT ABDOMEN AND PELVIS WITH CONTRAST  Technique:  Multidetector CT imaging of the abdomen and pelvis was performed following the standard protocol during bolus administration of intravenous contrast.  Contrast: OMNIPAQUE IOHEXOL 300 MG/ML  SOLN  Comparison: CT scan 07/26/2008.  Findings: The lung bases are clear.  There is mild diffuse fatty infiltration of the liver but no focal hepatic lesions or biliary dilatation.  The gallbladder is normal. No common bile duct dilatation.  The pancreas is normal.  The spleen is normal in size.  No focal lesions.  The adrenal glands and kidneys are unremarkable.  The stomach, duodenum, small bowel and colon are unremarkable.  No inflammatory changes or mass lesions.  Low lying cecum deep in the pelvis.  The appendix is normal.  The bladder is unremarkable.  The prostate gland and seminal vesicles are normal.  No pelvic mass, adenopathy or free pelvic fluid collections.  The aorta is normal in caliber.  There are advanced atherosclerotic calcifications for age.  The major branch vessels are patent.  No mesenteric or retroperitoneal masses or adenopathy.  The portal and splenic veins patent.  The bony structures are unremarkable.  IMPRESSION: No acute abdominal/pelvic findings, mass lesions or lymphadenopathy.  Original Report Authenticated By: P. Loralie Champagne, M.D.   Dg Abd Acute W/chest  04/19/2012  *RADIOLOGY REPORT*  Clinical Data: Severe right-sided  abdominal pain, nausea and vomiting.  ACUTE ABDOMEN SERIES (ABDOMEN 2 VIEW & CHEST 1 VIEW)  Comparison: CT of the abdomen and pelvis performed 07/26/2008, and chest radiograph performed 11/19/2008  Findings: The lungs are well-aerated.  Mild peribronchial thickening is noted.  There is no evidence of focal opacification, pleural effusion or pneumothorax.  The cardiomediastinal silhouette is within normal limits.  Increased density of the right hemithorax is thought to reflect overlying soft tissues.  The liver appears mildly enlarged, measuring 21.9 cm in length; this appears mildly more prominent than in 2009.  The visualized bowel gas pattern is unremarkable.  Mild scattered stool and air are seen within the colon; there is no evidence of small bowel dilatation to suggest obstruction.  No free intra- abdominal air is identified on the provided upright view.  No acute osseous abnormalities are seen; the sacroiliac joints are unremarkable in appearance.  IMPRESSION:  1.  Unremarkable bowel gas  pattern; no free intra-abdominal air seen. 2.  No acute cardiopulmonary process identified. 3.  Apparent mild hepatomegaly.  Original Report Authenticated By: Tonia Ghent, M.D.     1. Abdominal pain   2. Anxiety       MDM  Abdominal pain with negative CT scan. No blood in stool and normal hemoglobin. Doubt colitis, intestinal abnormality or significant hernia.   Plan: Home Medications- percocet*; Home Treatments- gradual diet advance*; Recommended follow up- GI and PCP followup     Flint Melter, MD 04/20/12 [phone]

## 2012-04-20 NOTE — ED Notes (Signed)
Pt states seen at [facility] on Sunday for n/v and RLQ pain, pt c/o of same and now having rectal bleeding.

## 2012-04-20 NOTE — ED Notes (Signed)
Pt very upset on d/c, states "they said nothing is wrong with me, so it's in my head"; pts wife signed pts d/c'd instructions and took rx's

## 2012-10-06 ENCOUNTER — Encounter (HOSPITAL_COMMUNITY): Payer: Self-pay | Admitting: Dentistry

## 2013-03-08 IMAGING — CT CT ABD-PELV W/ CM
1 of 3 series · 14 of 32 positions shown, 19 images · IV contrast (APPLIED)
Comparison: CT scan 07/26/2008.

CLINICAL DATA: Abdominal pain and rectal bleeding.

CT ABDOMEN AND PELVIS WITH CONTRAST
TECHNIQUE: Multidetector CT imaging of the abdomen and pelvis was
performed following the standard protocol during bolus
administration of intravenous contrast.
Contrast: 100mL OMNIPAQUE IOHEXOL 300 MG/ML  SOLN

[Series 2: abd/pel with · axial · 0.67mm/px · z∈[-451,-101]mm · 14 of 80 slices shown, 19 images]
[im 5/80  soft-tissue]
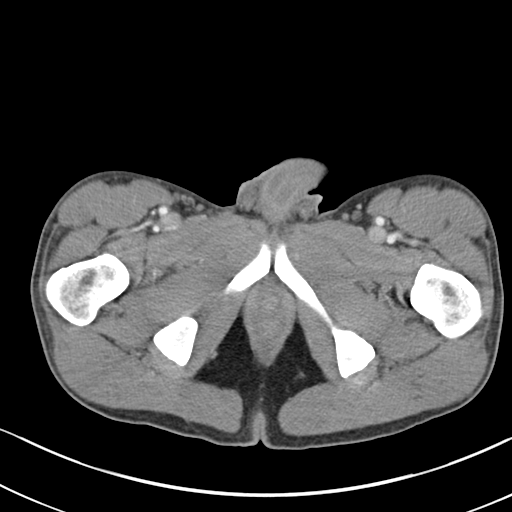
[im 5/80  bone]
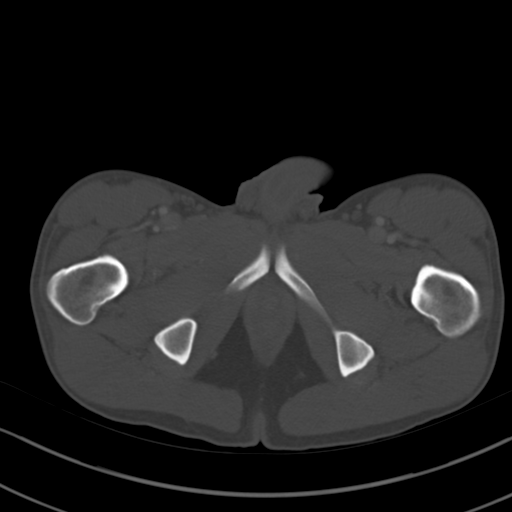
[im 9/80  soft-tissue]
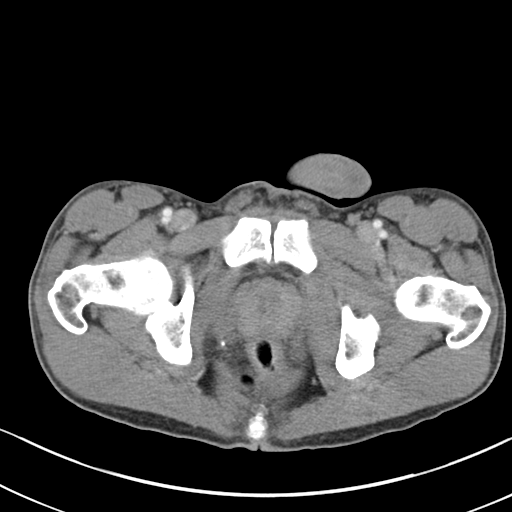
[im 18/80  soft-tissue]
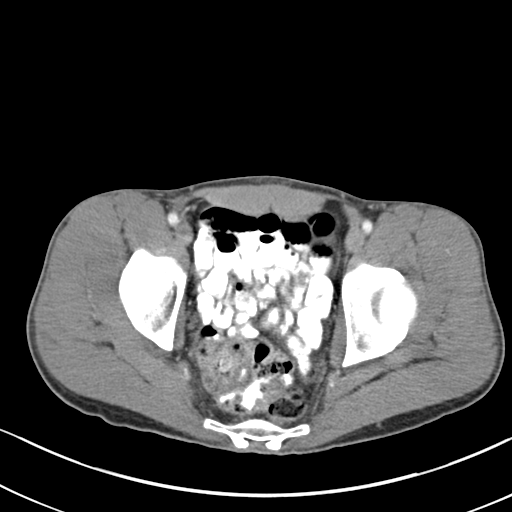
[im 22/80  soft-tissue]
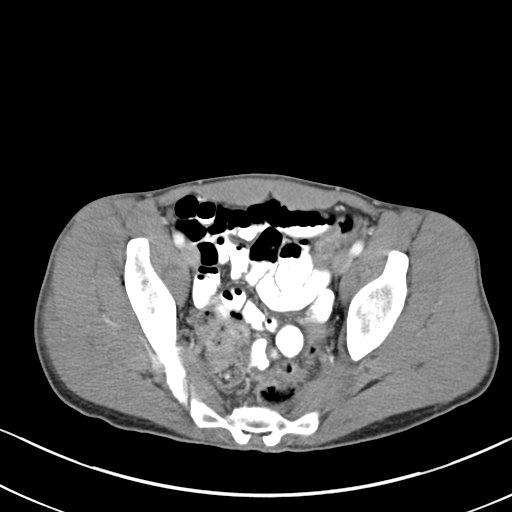
[im 27/80  soft-tissue]
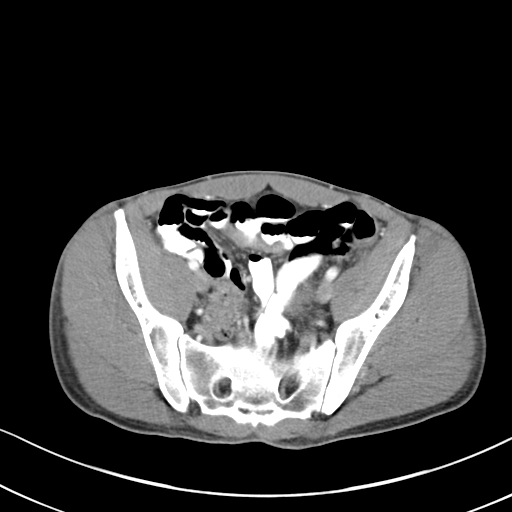
[im 36/80  soft-tissue]
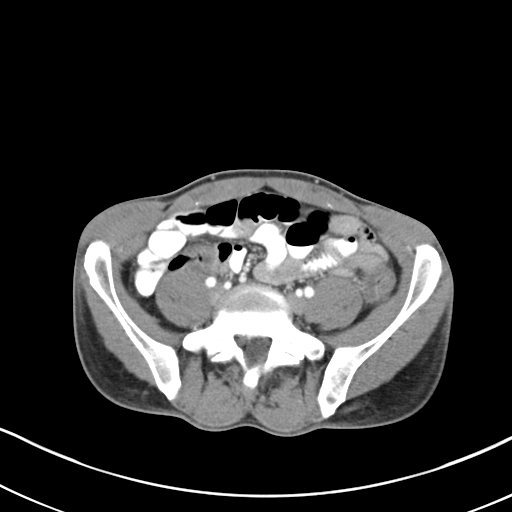
[im 40/80  soft-tissue]
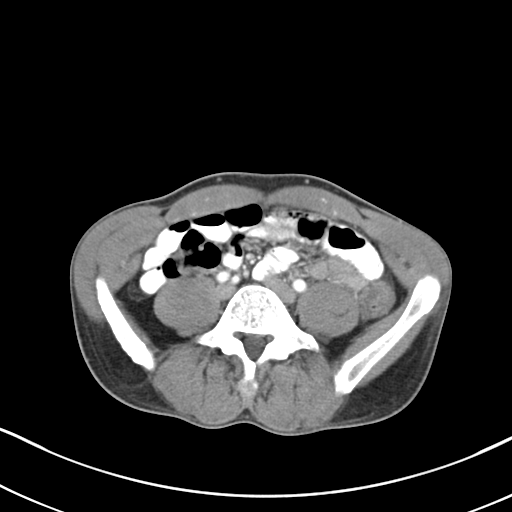
[im 44/80  soft-tissue]
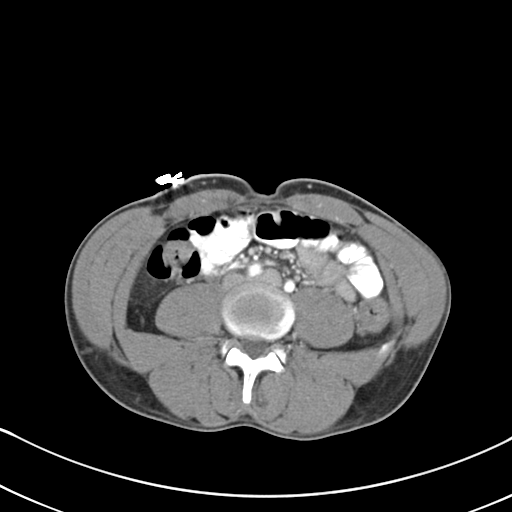
[im 53/80  soft-tissue]
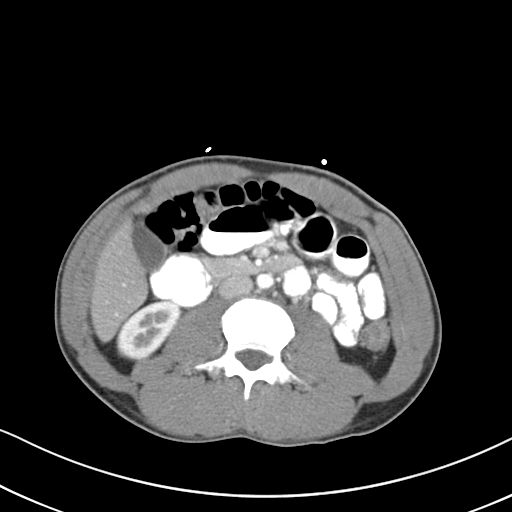
[im 53/80  bone]
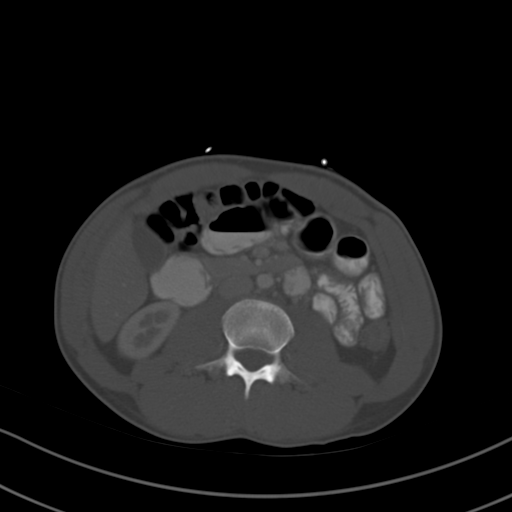
[im 58/80  soft-tissue]
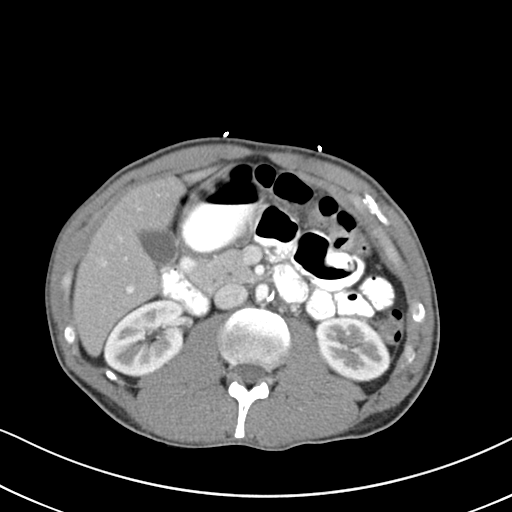
[im 62/80  soft-tissue]
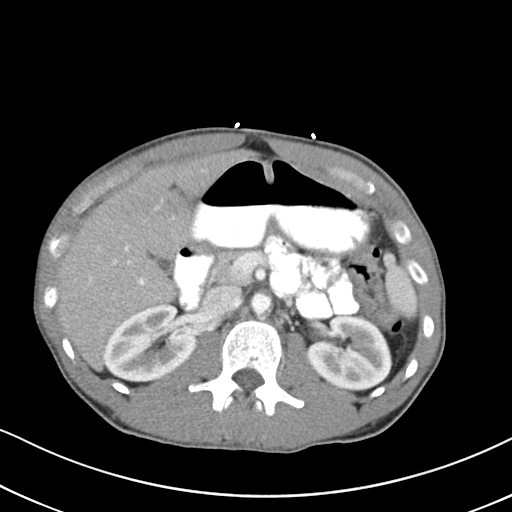
[im 62/80  lung]
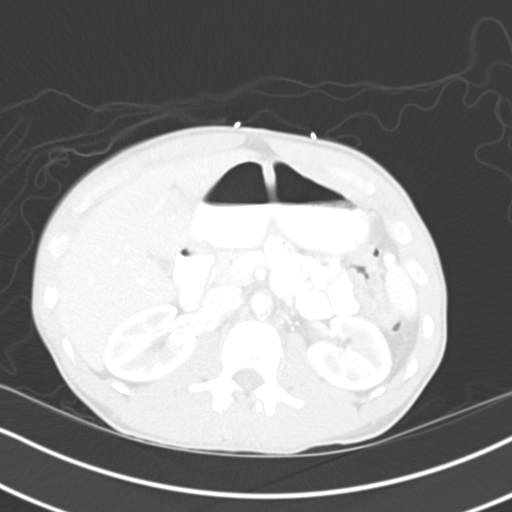
[im 66/80  lung]
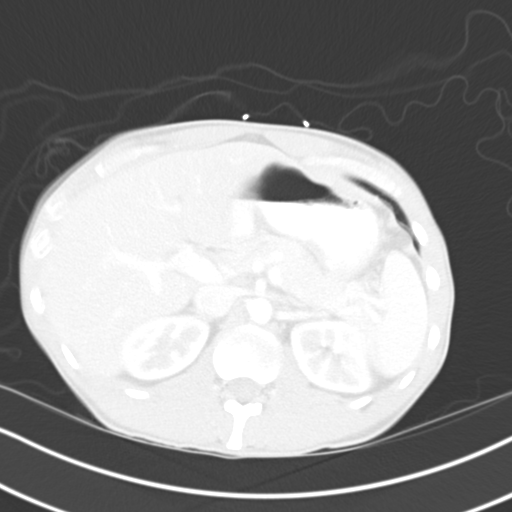
[im 71/80  soft-tissue]
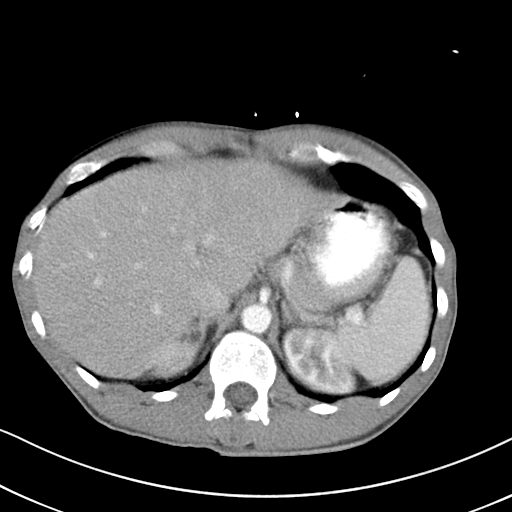
[im 71/80  lung]
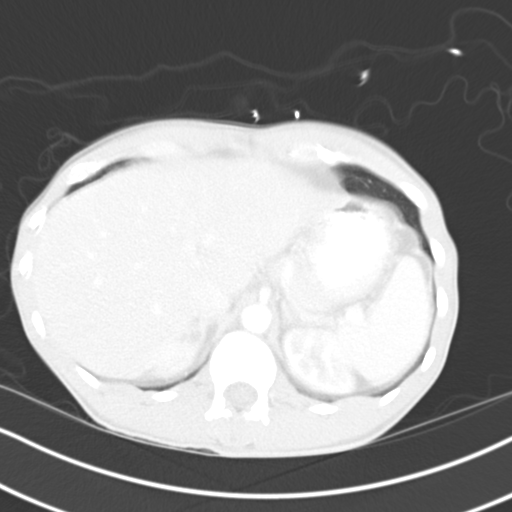
[im 75/80  soft-tissue]
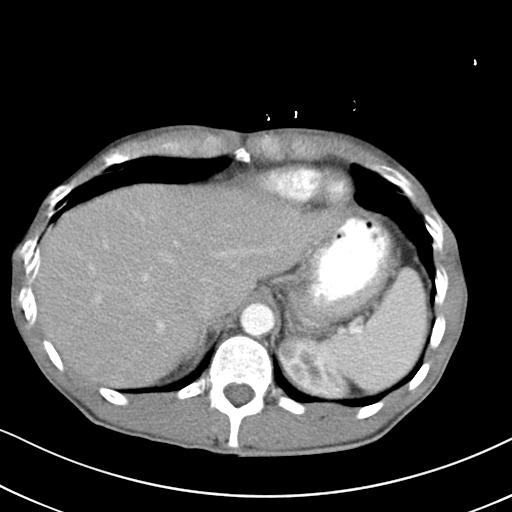
[im 75/80  lung]
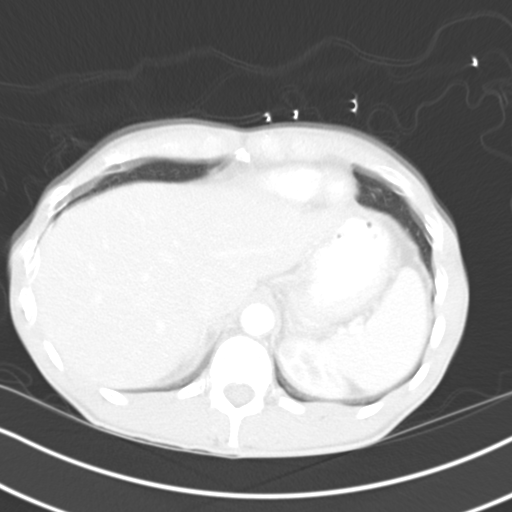

[14 of 32 positions shown; findings below may reference images not displayed]

FINDINGS: The lung bases are clear.

There is mild diffuse fatty infiltration of the liver but no focal
hepatic lesions or biliary dilatation.  The gallbladder is normal.
No common bile duct dilatation.  The pancreas is normal.  The
spleen is normal in size.  No focal lesions.  The adrenal glands
and kidneys are unremarkable.

The stomach, duodenum, small bowel and colon are unremarkable.  No
inflammatory changes or mass lesions.  Low lying cecum deep in the
pelvis.  The appendix is normal.  The bladder is unremarkable.  The
prostate gland and seminal vesicles are normal.  No pelvic mass,
adenopathy or free pelvic fluid collections.

The aorta is normal in caliber.  There are advanced atherosclerotic
calcifications for age.  The major branch vessels are patent.  No
mesenteric or retroperitoneal masses or adenopathy.  The portal and
splenic veins patent.

The bony structures are unremarkable.
IMPRESSION: No acute abdominal/pelvic findings, mass lesions or
lymphadenopathy.

## 2020-03-19 ENCOUNTER — Encounter: Payer: Self-pay | Admitting: Family Medicine

## 2020-03-19 ENCOUNTER — Other Ambulatory Visit: Payer: Self-pay

## 2020-03-19 ENCOUNTER — Ambulatory Visit (INDEPENDENT_AMBULATORY_CARE_PROVIDER_SITE_OTHER): Payer: 59 | Admitting: Family Medicine

## 2020-03-19 VITALS — BP 137/85 | HR 81 | Temp 99.6°F | Ht 69.0 in | Wt 137.8 lb

## 2020-03-19 DIAGNOSIS — Z13220 Encounter for screening for lipoid disorders: Secondary | ICD-10-CM

## 2020-03-19 DIAGNOSIS — R6884 Jaw pain: Secondary | ICD-10-CM

## 2020-03-19 DIAGNOSIS — Z130 Encounter for screening for diseases of the blood and blood-forming organs and certain disorders involving the immune mechanism: Secondary | ICD-10-CM | POA: Diagnosis not present

## 2020-03-19 DIAGNOSIS — Z13228 Encounter for screening for other metabolic disorders: Secondary | ICD-10-CM | POA: Diagnosis not present

## 2020-03-19 DIAGNOSIS — F411 Generalized anxiety disorder: Secondary | ICD-10-CM | POA: Diagnosis not present

## 2020-03-19 LAB — CMP14+EGFR
ALT: 26 IU/L (ref 0–44)
AST: 21 IU/L (ref 0–40)
Albumin/Globulin Ratio: 2.5 — ABNORMAL HIGH (ref 1.2–2.2)
Albumin: 4.9 g/dL (ref 4.0–5.0)
Alkaline Phosphatase: 110 IU/L (ref 39–117)
BUN/Creatinine Ratio: 13 (ref 9–20)
BUN: 13 mg/dL (ref 6–24)
Bilirubin Total: 0.5 mg/dL (ref 0.0–1.2)
CO2: 21 mmol/L (ref 20–29)
Calcium: 10.7 mg/dL — ABNORMAL HIGH (ref 8.7–10.2)
Chloride: 104 mmol/L (ref 96–106)
Creatinine, Ser: 0.99 mg/dL (ref 0.76–1.27)
GFR calc Af Amer: 106 mL/min/{1.73_m2} (ref >59)
GFR calc non Af Amer: 92 mL/min/{1.73_m2} (ref >59)
Globulin, Total: 2 g/dL (ref 1.5–4.5)
Glucose: 93 mg/dL (ref 65–99)
Potassium: 4.4 mmol/L (ref 3.5–5.2)
Sodium: 139 mmol/L (ref 134–144)
Total Protein: 6.9 g/dL (ref 6.0–8.5)

## 2020-03-19 LAB — CBC WITH DIFFERENTIAL/PLATELET
Basophils Absolute: 0.1 10*3/uL (ref 0.0–0.2)
Basos: 1 %
EOS (ABSOLUTE): 0.2 10*3/uL (ref 0.0–0.4)
Eos: 2 %
Hematocrit: 49.3 % (ref 37.5–51.0)
Hemoglobin: 16.6 g/dL (ref 13.0–17.7)
Immature Grans (Abs): 0 10*3/uL (ref 0.0–0.1)
Immature Granulocytes: 0 %
Lymphocytes Absolute: 2.2 10*3/uL (ref 0.7–3.1)
Lymphs: 24 %
MCH: 28.4 pg (ref 26.6–33.0)
MCHC: 33.7 g/dL (ref 31.5–35.7)
MCV: 84 fL (ref 79–97)
Monocytes Absolute: 0.9 10*3/uL (ref 0.1–0.9)
Monocytes: 10 %
Neutrophils Absolute: 5.8 10*3/uL (ref 1.4–7.0)
Neutrophils: 63 %
Platelets: 311 10*3/uL (ref 150–450)
RBC: 5.84 x10E6/uL — ABNORMAL HIGH (ref 4.14–5.80)
RDW: 14.3 % (ref 11.6–15.4)
WBC: 9.1 10*3/uL (ref 3.4–10.8)

## 2020-03-19 LAB — LIPID PANEL
Chol/HDL Ratio: 5.9 ratio — ABNORMAL HIGH (ref 0.0–5.0)
Cholesterol, Total: 199 mg/dL (ref 100–199)
HDL: 34 mg/dL — ABNORMAL LOW (ref >39)
LDL Chol Calc (NIH): 133 mg/dL — ABNORMAL HIGH (ref 0–99)
Triglycerides: 179 mg/dL — ABNORMAL HIGH (ref 0–149)
VLDL Cholesterol Cal: 32 mg/dL (ref 5–40)

## 2020-03-19 MED ORDER — BUSPIRONE HCL 7.5 MG PO TABS: 7.5000 mg | ORAL_TABLET | Freq: Two times a day (BID) | ORAL | 2 refills | Status: DC

## 2020-03-19 MED ORDER — DICLOFENAC SODIUM 75 MG PO TBEC: 75.0000 mg | DELAYED_RELEASE_TABLET | Freq: Two times a day (BID) | ORAL | 2 refills | Status: DC

## 2020-03-19 MED ORDER — TIZANIDINE HCL 4 MG PO TABS: 4.0000 mg | ORAL_TABLET | Freq: Four times a day (QID) | ORAL | 2 refills | Status: DC | PRN

## 2020-03-19 NOTE — Progress Notes (Addendum)
New Patient Office Visit  Assessment & Plan:  1. Generalized anxiety disorder - Started patient on buspirone 7.5 mg twice daily today. - busPIRone (BUSPAR) 7.5 MG tablet; Take 1 tablet (7.5 mg total) by mouth 2 (two) times daily.  Dispense: 60 tablet; Refill: 2  2. Jaw pain - Trial of NSAIDs and muscle relaxers.  We did request records from Dr. Albin Felling who was the oral surgeon that did all of his surgery in his 60's to see if there is any mention of a possibility of cancer.  If symptoms do not improve, may proceed with imaging. - diclofenac (VOLTAREN) 75 MG EC tablet; Take 1 tablet (75 mg total) by mouth 2 (two) times daily.  Dispense: 30 tablet; Refill: 2 - tiZANidine (ZANAFLEX) 4 MG tablet; Take 1 tablet (4 mg total) by mouth every 6 (six) hours as needed for muscle spasms.  Dispense: 30 tablet; Refill: 2  3. Screening for deficiency anemia - CBC with Differential/Platelet  4. Screening for metabolic disorder - TDS28+JGOT  5. Screening for lipid disorders - Lipid panel   Follow-up: Return in about 6 weeks (around 04/30/2020) for anxiety (telephone).   Hendricks Limes, MSN, APRN, FNP-C [facility]  Subjective:  Patient ID: Zachary Rodriguez, male    DOB: 1976-02-22  Age: 44 y.o. MRN: [id number]  Patient Care Team: Loman Brooklyn, FNP as PCP - General (Family Medicine)  CC:  Chief Complaint  Patient presents with  . New Patient (Initial Visit)    States he never goes to the doctors so does not have previous provider.   . Establish Care  . Anxiety    x 5 years and is getting worse. Patient states that he has never been on medications for it.  . Jaw Pain    x 3 months     HPI Zachary Rodriguez presents to establish care. Patient is not transferring care; he reports he does not have a previous PCP.   Patient is accompanied by his wife who he is okay with being present.  Patient c/o anxiety that has been going on for the past five years and is getting  worse. He has never taken medication to treat anxiety.  He reports he gets so anxious that he literally is dripping in sweat.  His wife does take buspirone 10 mg which he will occasionally take when his anxiety is at its max.  Depression screen PHQ 2/9 03/19/2020  Decreased Interest 0  Down, Depressed, Hopeless 2  PHQ - 2 Score 2  Altered sleeping 3  Tired, decreased energy 1  Change in appetite 3  Feeling bad or failure about yourself  0  Trouble concentrating 1  Moving slowly or fidgety/restless 3  Suicidal thoughts 0  PHQ-9 Score 13    GAD 7 : Generalized Anxiety Score 03/19/2020  Nervous, Anxious, on Edge 3  Control/stop worrying 3  Worry too much - different things 3  Trouble relaxing 2  Restless 2  Easily annoyed or irritable 2  Afraid - awful might happen 1  Total GAD 7 Score 16    Patient is also concerned about jaw pain that has been going on for the past three months.  The pain is constant.  He describes it as an ache in the jaw bone on the right side.  He rates the pain 5/10 and states that he does have a very high pain tolerance.  Patient has taken multiple antibiotics in the past 3 months to treat  what ever is going on.  He has had a round of doxycycline, amoxicillin, and a Z-Pak.  He reports having extensive mouth work in his 38s during which all of his teeth were removed.  His wife reports she remembers asking the oral surgeon if there was any cancer in his jaw at that time and his response was if it was it is gone now.   Review of Systems  Constitutional: Negative for chills, fever, malaise/fatigue and weight loss.  HENT: Negative for congestion, ear discharge, ear pain, nosebleeds, sinus pain, sore throat and tinnitus.   Eyes: Negative for blurred vision, double vision, pain, discharge and redness.  Respiratory: Negative for cough, shortness of breath and wheezing.   Cardiovascular: Negative for chest pain, palpitations and leg swelling.  Gastrointestinal: Negative  for abdominal pain, constipation, diarrhea, heartburn, nausea and vomiting.  Genitourinary: Negative for dysuria, frequency and urgency.  Musculoskeletal: Negative for myalgias.  Skin: Negative for rash.  Neurological: Negative for dizziness, seizures, weakness and headaches.  Psychiatric/Behavioral: Negative for depression, substance abuse and suicidal ideas. The patient is nervous/anxious.     Current Outpatient Medications:  .  busPIRone (BUSPAR) 7.5 MG tablet, Take 1 tablet (7.5 mg total) by mouth 2 (two) times daily., Disp: 60 tablet, Rfl: 2 .  diclofenac (VOLTAREN) 75 MG EC tablet, Take 1 tablet (75 mg total) by mouth 2 (two) times daily., Disp: 30 tablet, Rfl: 2 .  tiZANidine (ZANAFLEX) 4 MG tablet, Take 1 tablet (4 mg total) by mouth every 6 (six) hours as needed for muscle spasms., Disp: 30 tablet, Rfl: 2  No Known Allergies  Past Medical History:  Diagnosis Date  . Abdominal hernia   . Elevated LDL cholesterol level 03/20/2020    Past Surgical History:  Procedure Laterality Date  . HERNIA REPAIR    . MOUTH SURGERY      Family History  Problem Relation Age of Onset  . COPD Paternal Grandfather   . Emphysema Paternal Grandfather     Social History   Socioeconomic History  . Marital status: Married    Spouse name: Not on file  . Number of children: Not on file  . Years of education: Not on file  . Highest education level: Not on file  Occupational History  . Not on file  Tobacco Use  . Smoking status: Current Every Day Smoker    Packs/day: 0.50    Types: Cigarettes  . Smokeless tobacco: Never Used  Substance and Sexual Activity  . Alcohol use: No  . Drug use: Not Currently    Types: Marijuana    Comment: 03/19/20- states no drugs   . Sexual activity: Not on file  Other Topics Concern  . Not on file  Social History Narrative  . Not on file   Social Determinants of Health   Financial Resource Strain:   . Difficulty of Paying Living Expenses:   Food  Insecurity:   . Worried About Charity fundraiser in the Last Year:   . Arboriculturist in the Last Year:   Transportation Needs:   . Film/video editor (Medical):   Marland Kitchen Lack of Transportation (Non-Medical):   Physical Activity:   . Days of Exercise per Week:   . Minutes of Exercise per Session:   Stress:   . Feeling of Stress :   Social Connections:   . Frequency of Communication with Friends and Family:   . Frequency of Social Gatherings with Friends and Family:   .  Attends Religious Services:   . Active Member of Clubs or Organizations:   . Attends Archivist Meetings:   Marland Kitchen Marital Status:   Intimate Partner Violence:   . Fear of Current or Ex-Partner:   . Emotionally Abused:   Marland Kitchen Physically Abused:   . Sexually Abused:     Objective:   Today's Vitals: BP 137/85   Pulse 81   Temp 99.6 F (37.6 C) (Temporal)   Ht 5' 9"  (1.753 m)   Wt 137 lb 12.8 oz (62.5 kg)   SpO2 98%   BMI 20.35 kg/m   Physical Exam Vitals reviewed.  Constitutional:      General: He is not in acute distress.    Appearance: Normal appearance. He is normal weight. He is not ill-appearing, toxic-appearing or diaphoretic.  HENT:     Head: Normocephalic and atraumatic.     Jaw: There is normal jaw occlusion. Tenderness (right jaw bone) present. No swelling.     Salivary Glands: Right salivary gland is not diffusely enlarged or tender. Left salivary gland is not diffusely enlarged or tender.     Right Ear: Tympanic membrane, ear canal and external ear normal. There is no impacted cerumen.     Left Ear: Tympanic membrane, ear canal and external ear normal. There is no impacted cerumen.     Mouth/Throat:     Mouth: Mucous membranes are moist. No injury, lacerations, oral lesions or angioedema.     Dentition: No dental abscesses or gum lesions.     Comments: No abnormalities of TM joint.  Eyes:     General: No scleral icterus.       Right eye: No discharge.        Left eye: No discharge.      Conjunctiva/sclera: Conjunctivae normal.  Cardiovascular:     Rate and Rhythm: Normal rate and regular rhythm.     Heart sounds: Normal heart sounds. No murmur. No friction rub. No gallop.   Pulmonary:     Effort: Pulmonary effort is normal. No respiratory distress.     Breath sounds: Normal breath sounds. No stridor. No wheezing, rhonchi or rales.  Musculoskeletal:        General: Normal range of motion.     Cervical back: Normal range of motion.  Lymphadenopathy:     Head:     Right side of head: No submental, submandibular, tonsillar, preauricular, posterior auricular or occipital adenopathy.     Left side of head: No submental, submandibular, tonsillar, preauricular, posterior auricular or occipital adenopathy.     Cervical: No cervical adenopathy.  Skin:    General: Skin is warm and dry.  Neurological:     Mental Status: He is alert and oriented to person, place, and time. Mental status is at baseline.  Psychiatric:        Mood and Affect: Mood normal.        Behavior: Behavior normal.        Thought Content: Thought content normal.        Judgment: Judgment normal.

## 2020-03-20 ENCOUNTER — Encounter: Payer: Self-pay | Admitting: Family Medicine

## 2020-03-20 DIAGNOSIS — E7800 Pure hypercholesterolemia, unspecified: Secondary | ICD-10-CM

## 2020-03-20 HISTORY — DX: Pure hypercholesterolemia, unspecified: E78.00

## 2020-03-27 ENCOUNTER — Telehealth: Payer: Self-pay | Admitting: Family Medicine

## 2020-03-27 NOTE — Telephone Encounter (Signed)
Dr Kristin Bruins called stating that he received a record request from our office for this patient and states that he is going to need a complete record request from [place] if we expect him to send any records. Says what we sent him had no info of why records were needed and didn't even have his name spelled correctly.

## 2020-03-28 NOTE — Telephone Encounter (Signed)
I have called and spoken with Dr. Verdie Shire personally. We discussed that I was requesting records due to patients extensive mouth work in his 30s and that the wife mentioned the possibility of cancer. The patient is currently having jaw pain. He assured me there was no mention of cancer and that was never a thought when he saw the patient. The patient had extreme dental phobia and had not properly taken care of his teeth. He had 32 broken teeth with retained root segments. He explained to me that the patients upper and lower jaw never lined up great and it was difficulty making his dentures. He mentioned pain could be due to this and the stress on the TM joint.

## 2020-03-29 ENCOUNTER — Encounter: Payer: Self-pay | Admitting: Family Medicine

## 2020-04-28 ENCOUNTER — Encounter: Payer: Self-pay | Admitting: Family Medicine

## 2020-05-01 ENCOUNTER — Ambulatory Visit (INDEPENDENT_AMBULATORY_CARE_PROVIDER_SITE_OTHER): Payer: 59 | Admitting: Family Medicine

## 2020-05-01 ENCOUNTER — Encounter: Payer: Self-pay | Admitting: Family Medicine

## 2020-05-01 DIAGNOSIS — F411 Generalized anxiety disorder: Secondary | ICD-10-CM | POA: Diagnosis not present

## 2020-05-01 DIAGNOSIS — E7800 Pure hypercholesterolemia, unspecified: Secondary | ICD-10-CM | POA: Diagnosis not present

## 2020-05-01 DIAGNOSIS — R6884 Jaw pain: Secondary | ICD-10-CM | POA: Diagnosis not present

## 2020-05-01 MED ORDER — MELOXICAM 7.5 MG PO TABS: 7.5000 mg | ORAL_TABLET | Freq: Every day | ORAL | 2 refills | Status: DC

## 2020-05-01 NOTE — Progress Notes (Signed)
Virtual Visit via Telephone Note  I connected with Zachary Rodriguez on 05/01/20 at 8:17 AM by telephone and verified that I am speaking with the correct person using two identifiers. Zachary Rodriguez is currently located at home and his wife is currently with him during this visit. The provider, Gwenlyn Fudge, FNP is located in their home at time of visit.  I discussed the limitations, risks, security and privacy concerns of performing an evaluation and management service by telephone and the availability of in person appointments. I also discussed with the patient that there may be a patient responsible charge related to this service. The patient expressed understanding and agreed to proceed.  Subjective: PCP: Gwenlyn Fudge, FNP  Chief Complaint  Patient presents with  . Anxiety   Approximately 6 weeks ago patient was started on buspirone 7.5 mg twice daily for anxiety.  He reports he has been doing okay and not sweating nearly as much as he used to.  Patient states he needs to just stay away from his triggers, some of which he has figured out.  His worst trigger is his father-in-law who has been in town for the past week and a half.  Patient states he was doing good before he came and therefore does not want to increase the dosage of buspirone.  GAD 7 : Generalized Anxiety Score 05/01/2020 03/19/2020  Nervous, Anxious, on Edge 3 3  Control/stop worrying 3 3  Worry too much - different things 0 3  Trouble relaxing 3 2  Restless 3 2  Easily annoyed or irritable 3 2  Afraid - awful might happen 0 1  Total GAD 7 Score 15 16  Anxiety Difficulty Somewhat difficult -    Patient had to stop both the diclofenac and tizanidine due to extreme nausea that he was experiencing with them.  He did go to a "denture placed" where he was told his pain is either coming from the bone or scar tissue.  They offered exploratory surgery to figure out what was going on but he does not want to do this at this  time.  Patient has been trying to eat better with regards to his elevated LDL.   ROS: Per HPI  Current Outpatient Medications:  .  busPIRone (BUSPAR) 7.5 MG tablet, Take 1 tablet (7.5 mg total) by mouth 2 (two) times daily., Disp: 60 tablet, Rfl: 2 .  diclofenac (VOLTAREN) 75 MG EC tablet, Take 1 tablet (75 mg total) by mouth 2 (two) times daily., Disp: 30 tablet, Rfl: 2 .  meloxicam (MOBIC) 7.5 MG tablet, Take 1 tablet (7.5 mg total) by mouth daily., Disp: 30 tablet, Rfl: 2 .  tiZANidine (ZANAFLEX) 4 MG tablet, Take 1 tablet (4 mg total) by mouth every 6 (six) hours as needed for muscle spasms., Disp: 30 tablet, Rfl: 2  No Known Allergies Past Medical History:  Diagnosis Date  . Abdominal hernia   . Elevated LDL cholesterol level 03/20/2020    Observations/Objective: A&O  No respiratory distress or wheezing audible over the phone Mood, judgement, and thought processes all WNL   Assessment and Plan: 1. Generalized anxiety disorder - Well controlled on current regimen.  Patient reports his score is still elevated because it is based on the past 2 weeks when his father-in-law was in town.  No change in medication dosage or frequency.  2. Jaw pain - Rx'd meloxicam for pain.  Advised patient to let me know via MyChart if this is working  for him or not.  Discussed we can increase the dosage to 15 mg if needed.  Discussed a muscle relaxer is not likely to help him if his pain is from bone or scar tissue, so I did not prescribe a different one of them. - meloxicam (MOBIC) 7.5 MG tablet; Take 1 tablet (7.5 mg total) by mouth daily.  Dispense: 30 tablet; Refill: 2  3. Elevated LDL cholesterol level - Patient is working on diet.  We discussed dietary changes that he could make and smoking cessation.  Also discussed he could try adding fish oil or red yeast rice to his daily medication regimen.   Follow Up Instructions:  I discussed the assessment and treatment plan with the patient. The  patient was provided an opportunity to ask questions and all were answered. The patient agreed with the plan and demonstrated an understanding of the instructions.   The patient was advised to call back or seek an in-person evaluation if the symptoms worsen or if the condition fails to improve as anticipated.  The above assessment and management plan was discussed with the patient. The patient verbalized understanding of and has agreed to the management plan. Patient is aware to call the clinic if symptoms persist or worsen. Patient is aware when to return to the clinic for a follow-up visit. Patient educated on when it is appropriate to go to the emergency department.   Time call ended: 8:32 AM  I provided 17 minutes of non-face-to-face time during this encounter.  Hendricks Limes, MSN, APRN, FNP-C [facility] 05/01/20

## 2020-10-02 ENCOUNTER — Ambulatory Visit: Payer: 59 | Admitting: Family Medicine

## 2020-10-09 ENCOUNTER — Other Ambulatory Visit: Payer: Self-pay

## 2020-10-09 ENCOUNTER — Encounter: Payer: Self-pay | Admitting: Nurse Practitioner

## 2020-10-09 ENCOUNTER — Ambulatory Visit: Payer: 59 | Admitting: Nurse Practitioner

## 2020-10-09 VITALS — BP 138/83 | HR 88 | Temp 98.2°F | Ht 69.0 in | Wt 152.0 lb

## 2020-10-09 DIAGNOSIS — F339 Major depressive disorder, recurrent, unspecified: Secondary | ICD-10-CM | POA: Diagnosis not present

## 2020-10-09 DIAGNOSIS — F411 Generalized anxiety disorder: Secondary | ICD-10-CM

## 2020-10-09 NOTE — Assessment & Plan Note (Signed)
Patient is following up for depression.  Patient reports he stopped taking medication due to nausea and did not like how antidepressants made him feel. Completed GAD-7, PHQ-9. Provided education to patient with printed handouts given.  Provided patient with counselor information.  Patient denies suicidal ideation today. Will follow up with primary care provider to complete [facility].  Advised patient to go to the ED with any suicidal ideations or panic attacks.

## 2020-10-09 NOTE — Assessment & Plan Note (Signed)
Generalized anxiety disorder is not well controlled with current medication.  BuSpar 7.5 mg tablet twice daily.  Patient stopped taking medication and preferred to get better on his own patient is planning to make changes to lifestyle that causes his anxiety. Provided education to patient with printed handouts given.  Advised patient to follow-up with worsening or unresolved symptoms.

## 2020-10-09 NOTE — Patient Instructions (Signed)
Living With Depression Everyone experiences occasional disappointment, sadness, and loss in their lives. When you are feeling down, blue, or sad for at least 2 weeks in a row, it may mean that you have depression. Depression can affect your thoughts and feelings, relationships, daily activities, and physical health. It is caused by changes in the way your brain functions. If you receive a diagnosis of depression, your health care provider will tell you which type of depression you have and what treatment options are available to you. If you are living with depression, there are ways to help you recover from it and also ways to prevent it from coming back. How to cope with lifestyle changes Coping with stress     Stress is your bodys reaction to life changes and events, both good and bad. Stressful situations may include:  Getting married.  The death of a spouse.  Losing a job.  Retiring.  Having a baby. Stress can last just a few hours or it can be ongoing. Stress can play a major role in depression, so it is important to learn both how to cope with stress and how to think about it differently. Talk with your health care provider or a counselor if you would like to learn more about stress reduction. He or she may suggest some stress reduction techniques, such as:  Music therapy. This can include creating music or listening to music. Choose music that you enjoy and that inspires you.  Mindfulness-based meditation. This kind of meditation can be done while sitting or walking. It involves being aware of your normal breaths, rather than trying to control your breathing.  Centering prayer. This is a kind of meditation that involves focusing on a spiritual word or phrase. Choose a word, phrase, or sacred image that is meaningful to you and that brings you peace.  Deep breathing. To do this, expand your stomach and inhale slowly through your nose. Hold your breath for 3-5 seconds, then  exhale slowly, allowing your stomach muscles to relax.  Muscle relaxation. This involves intentionally tensing muscles then relaxing them. Choose a stress reduction technique that fits your lifestyle and personality. Stress reduction techniques take time and practice to develop. Set aside 5-15 minutes a day to do them. Therapists can offer training in these techniques. The training may be covered by some insurance plans. Other things you can do to manage stress include:  Keeping a stress diary. This can help you learn what triggers your stress and ways to control your response.  Understanding what your limits are and saying no to requests or events that lead to a schedule that is too full.  Thinking about how you respond to certain situations. You may not be able to control everything, but you can control how you react.  Adding humor to your life by watching funny films or TV shows.  Making time for activities that help you relax and not feeling guilty about spending your time this way.  Medicines Your health care provider may suggest certain medicines if he or she feels that they will help improve your condition. Avoid using alcohol and other substances that may prevent your medicines from working properly (may interact). It is also important to:  Talk with your pharmacist or health care provider about all the medicines that you take, their possible side effects, and what medicines are safe to take together.  Make it your goal to take part in all treatment decisions (shared decision-making). This includes giving input  on the side effects of medicines. It is best if shared decision-making with your health care provider is part of your total treatment plan. If your health care provider prescribes a medicine, you may not notice the full benefits of it for 4-8 weeks. Most people who are treated for depression need to be on medicine for at least 6-12 months after they feel better. If you are taking  medicines as part of your treatment, do not stop taking medicines without first talking to your health care provider. You may need to have the medicine slowly decreased (tapered) over time to decrease the risk of harmful side effects. Relationships Your health care provider may suggest family therapy along with individual therapy and drug therapy. While there may not be family problems that are causing you to feel depressed, it is still important to make sure your family learns as much as they can about your mental health. Having your familys support can help make your treatment successful. How to recognize changes in your condition Everyone has a different response to treatment for depression. Recovery from major depression happens when you have not had signs of major depression for two months. This may mean that you will start to:  Have more interest in doing activities.  Feel less hopeless than you did 2 months ago.  Have more energy.  Overeat less often, or have better or improving appetite.  Have better concentration. Your health care provider will work with you to decide the next steps in your recovery. It is also important to recognize when your condition is getting worse. Watch for these signs:  Having fatigue or low energy.  Eating too much or too little.  Sleeping too much or too little.  Feeling restless, agitated, or hopeless.  Having trouble concentrating or making decisions.  Having unexplained physical complaints.  Feeling irritable, angry, or aggressive. Get help as soon as you or your family members notice these symptoms coming back. How to get support and help from others How to talk with friends and family members about your condition  Talking to friends and family members about your condition can provide you with one way to get support and guidance. Reach out to trusted friends or family members, explain your symptoms to them, and let them know that you are  working with a health care provider to treat your depression. Financial resources Not all insurance plans cover mental health care, so it is important to check with your insurance carrier. If paying for co-pays or counseling services is a problem, search for a local or county mental health care center. They may be able to offer public mental health care services at low or no cost when you are not able to see a private health care provider. If you are taking medicine for depression, you may be able to get the generic form, which may be less expensive. Some makers of prescription medicines also offer help to patients who cannot afford the medicines they need. Follow these instructions at home:   Get the right amount and quality of sleep.  Cut down on using caffeine, tobacco, alcohol, and other potentially harmful substances.  Try to exercise, such as walking or lifting small weights.  Take over-the-counter and prescription medicines only as told by your health care provider.  Eat a healthy diet that includes plenty of vegetables, fruits, whole grains, low-fat dairy products, and lean protein. Do not eat a lot of foods that are high in solid fats, added sugars, or  salt.  Keep all follow-up visits as told by your health care provider. This is important. Contact a health care provider if:  You stop taking your antidepressant medicines, and you have any of these symptoms: ? Nausea. ? Headache. ? Feeling lightheaded. ? Chills and body aches. ? Not being able to sleep (insomnia).  You or your friends and family think your depression is getting worse. Get help right away if:  You have thoughts of hurting yourself or others. If you ever feel like you may hurt yourself or others, or have thoughts about taking your own life, get help right away. You can go to your nearest emergency department or call:  Your local emergency services (911 in the [place]  A suicide crisis helpline, such as the  [place] at 564-468-1414. This is open 24-hours a day. Summary  If you are living with depression, there are ways to help you recover from it and also ways to prevent it from coming back.  Work with your health care team to create a management plan that includes counseling, stress management techniques, and healthy lifestyle habits. This information is not intended to replace advice given to you by your health care provider. Make sure you discuss any questions you have with your health care provider. Document Revised: 03/25/2019 Document Reviewed: 11/03/2016 Elsevier Patient Education  2020 [place] With Anxiety Anxiety disorders are mental health conditions that cause overwhelming feelings of nervousness or worry. These feelings interfere with daily activities and relationships. Anxiety disorders include:  Generalized anxiety disorder (GAD).  Social anxiety.  Post-traumatic stress disorder (PTSD). When a person has an anxiety disorder, his or her condition can affect others around him or her, such as friends and family members. Friends and family can help by offering support and understanding. What do I need to know about this condition? Anxiety is the mental and physical experience of nervousness or worry that you might feel when you think about a stressful event. Occasional anxiety is normal, but a person with an anxiety disorder becomes preoccupied with this worry. He or she may know that the anxiety is not logical, but knowing this does not relieve the discomfort that he or she feels. Anxiety disorders cause a great deal of distress and prevent someone from having a normal daily life. Someone with an anxiety disorder may:  Experience anxiety that: ? May or may not have a specific trigger. ? Lasts for long periods of time. ? Causes physical problems over time. ? Is far more intense than normal anticipation. ? Occurs at unpredictable  times.  Feel restless or edgy.  Get fatigued easily.  Have trouble focusing.  Have muscle tension.  Have trouble falling asleep or staying asleep.  Be irritable and occasionally have sudden expressions of strong feelings (outbursts).  Have worries that do not make sense to you. What do I need to know about the treatment options? Anxiety disorders are generally very treatable by mental health providers such as psychologists, psychiatrists, and clinical social workers. Treatment may include one or more of the following:  Psychotherapy, also called talk therapy or counseling. Types of psychotherapy that are used to treat anxiety include: ? Cognitive behavioral therapy (CBT). This type of therapy teaches a person how to recognize unhealthy feelings, thoughts, and behaviors, and how to replace those feelings with positive thoughts and actions. ? Behavior therapy that trains a person to relax and self-soothe. This also involves gradually exposing the person to the cause of  the anxiety (progressive exposure therapy). ? Biofeedback. This type of therapy focuses on trying to control certain body functions, like heart rate, to lessen the physical impact of anxiety. ? Mindfulness-based stress reduction training. This uses education, meditation, and yoga to help a person stay focused on the present instead of living in the past or worrying about the future. ? Acceptance and commitment therapy (ACT). This helps a person to focus on acceptance, rather than trying to control every situation. ? Family therapy. This treatment helps family members to communicate and deal with conflict in healthy ways.  Medicine to treat anxiety and help to control certain emotions and behaviors.  Mind-body programs. These programs encourage the person with anxiety to be involved in his or her treatment and feel empowered. Mind-body programs may include mindfulness-based stress reduction training, yoga, or tai chi. How can  I create a safe environment?  For certain types of anxiety, such as PTSD, you may want to: ? Remove alcohol and prescription medicines from your loved one's home, or limit the amount of these substances in the home. This can help to prevent your loved one from abusing alcohol and prescription medicines. ? Remove or lock up guns and other weapons. If you do not have a safe place to keep a gun, local law enforcement may store a gun for you.  Make a written crisis plan. Include important phone numbers, such as the local crisis intervention team. Make sure that: ? The person with anxiety knows about this plan and agrees with it. ? Everyone who has regular contact with the person knows about the plan and knows what to do in an emergency. ? The written plan is easily accessible and can be quickly put into action. How should I care for myself? It is important to find ways to care for your body, mind, and well-being while supporting someone with anxiety.  Try to maintain your normal routines. This can help you remember that your life is about more than your loved one's condition.  Understand what your limits are. Say "no" to requests or events that lead to a schedule that is too busy.  Make time for activities that help you relax, and try to not feel guilty about taking time for yourself.  Spend time with friends and family.  Consider trying meditation and deep breathing exercises to lower your stress. Attend some mind-body classes by yourself or with your loved one.  Get plenty of sleep.  Exercise, even if it is just taking a short walk a few times a week.  If you are struggling emotionally with guilt, fear, or anger, consider working with a therapist. What are some signs that the condition is getting worse? Signs that your loved one's condition may be getting worse include:  Dramatic mood swings.  Staying away from activities that he or she used to enjoy.  Drinking more alcohol than  normal.  Either seeming tearful or seeming to lack emotion.  Talking about "not feeling right."  Staying away from others (isolating himself or herself). Where to find support Talk about the condition Good communication is the key to supporting your friend or family member. Here are a few things to keep in mind:  Be careful about too much prodding. Try not to overdo reminders to an adult friend or family member about things like taking medicines. Ask how your loved one prefers that you help.  Ask questions and then listen to your loved one's response. Be available if your friend  or family member wants to talk, but give your loved one space if he or she does not feel like talking.  Never ignore comments about suicide, and do not try to avoid the subject of suicide. Talking about suicide will not make your loved one want to act on it. You or your loved one can reach out 24 hours a day to get free, private support (on the phone or a live online chat) from a suicide crisis helpline, such as the [place] at (908) 402-5808.  Be encouraging and offer emotional support. This can help to lower stress. Even saying something simple to comfort your loved one may help.  If your loved one is open to it, go with him or her to visits with a counselor or health care provider. Get suggestions directly from your loved one's care providers about when to get help if you are concerned about behavior changes. Privacy laws limit how much a person's health care provider can share with you without your loved one's permission, but if you feel that a situation is an emergency, do not wait to call a health care provider or emergency services. Find support and resources  Consider joining self-help and support groups, not only for your friend or family member, but also for yourself. People in these peer and family support groups understand what you and your loved one are going through. They can  help you feel a sense of hope and connect you with local resources to help you learn more. ? You may also consider family therapy. General support  Make an effort to learn all you can about your loved one's form of anxiety.  Include your loved one in activities. Invite him or her to go for walks and outings.  Help your loved one follow his or her treatment plan as directed by health care providers. This could mean driving him or her to therapy sessions or suggesting ways to cope with stress.  Remember that your support really matters. Social support is a huge benefit for someone who is coping with anxiety. Where to find more information A health care provider may be able to recommend mental health resources that are available online or over the phone. You could start with:  Government sites such as the Substance Abuse and [place] (SAMHSA): ktimeonline.com  National mental health organizations such as the [employer] on [place] ([place]): www.nami.org Get help right away if:  Your loved one expresses thoughts about harming himself or herself or others.  Your loved one's behavior becomes hard to predict (erratic).  Your loved one shows behavior that does not make sense with the current time, place, or circumstances. These behaviors may include seeing, feeling, tasting, or hearing things that are not real (hallucinations) or having flashbacks. If you ever feel like your loved one may hurt himself or herself or others, or may have thoughts about taking his or her own life, get help right away. You can go to your nearest emergency department or call:  Your local emergency services (911 in the [place]  A suicide crisis helpline, such as the [place] at (601)868-0486. This is open 24 hours a day. Summary  People with anxiety disorders experience overwhelming feelings of nervousness or worry. Friends and family can help by  offering support and understanding.  Anxiety disorders are generally very treatable by mental health providers. They can be treated with psychotherapy (also known as talk therapy), behavior therapy, medicine, and  mind-body programs.  Be compassionate and listen to your loved one. Be available if your friend or family member wants to talk, but give your loved one space if he or she does not feel like talking.  Find ways to care for your own body, mind, and well-being while supporting someone with anxiety. Try to maintain your normal routines and make time to do things that you enjoy. This information is not intended to replace advice given to you by your health care provider. Make sure you discuss any questions you have with your health care provider. Document Revised: 03/24/2019 Document Reviewed: 04/14/2017 Elsevier Patient Education  [place].

## 2020-10-09 NOTE — Progress Notes (Signed)
Established Patient Office Visit  Subjective:  Patient ID: Zachary Rodriguez, male    DOB: 1976-08-06  Age: 44 y.o. MRN: [id number]  CC:  Chief Complaint  Patient presents with  . Anxiety    5 month follow up    HPI Zachary Rodriguez presents for Depression & Anxiety: Patient complains of depression. He complains of depressed mood. Onset was approximately 6 weeks ago, unchanged since that time.  He denies current suicidal and homicidal plan or intent.   Family history significant for no psychiatric illness.Possible organic causes contributing are: none.  Risk factors: previous episode of depression Previous treatment includes Busper and Busper. He complains of the following side effects from the treatment: GI disturbance. Patient is not currently taking medication.   Past Medical History:  Diagnosis Date  . Abdominal hernia   . Elevated LDL cholesterol level 03/20/2020    Past Surgical History:  Procedure Laterality Date  . HERNIA REPAIR    . MOUTH SURGERY      Family History  Problem Relation Age of Onset  . COPD Paternal Grandfather   . Emphysema Paternal Grandfather       Outpatient Medications Prior to Visit  Medication Sig Dispense Refill  . meloxicam (MOBIC) 7.5 MG tablet Take 1 tablet (7.5 mg total) by mouth daily. 30 tablet 2  . busPIRone (BUSPAR) 7.5 MG tablet Take 1 tablet (7.5 mg total) by mouth 2 (two) times daily. 60 tablet 2   No facility-administered medications prior to visit.    No Known Allergies  ROS Review of Systems  Psychiatric/Behavioral: Negative for self-injury, sleep disturbance and suicidal ideas. The patient is nervous/anxious. The patient is not hyperactive.   All other systems reviewed and are negative.     Objective:    Physical Exam Eyes:     Conjunctiva/sclera: Conjunctivae normal.  Cardiovascular:     Rate and Rhythm: Normal rate and regular rhythm.     Pulses: Normal pulses.     Heart sounds: Normal heart sounds.    Pulmonary:     Effort: Pulmonary effort is normal.     Breath sounds: Normal breath sounds.  Abdominal:     General: Bowel sounds are normal.  Musculoskeletal:        General: Normal range of motion.  Skin:    General: Skin is warm.  Neurological:     Mental Status: He is alert and oriented to person, place, and time.  Psychiatric:     Comments: Depression with anxiety     BP 138/83   Pulse 88   Temp 98.2 F (36.8 C) (Temporal)   Ht 5\' 9"  (1.753 m)   Wt 152 lb (68.9 kg)   SpO2 97%   BMI 22.45 kg/m  Wt Readings from Last 3 Encounters:  10/09/20 152 lb (68.9 kg)  03/19/20 137 lb 12.8 oz (62.5 kg)  04/18/12 115 lb (52.2 kg)     No results found for: TSH Lab Results  Component Value Date   WBC 9.1 03/19/2020   HGB 16.6 03/19/2020   HCT 49.3 03/19/2020   MCV 84 03/19/2020   PLT 311 03/19/2020   Lab Results  Component Value Date   NA 139 03/19/2020   K 4.4 03/19/2020   CO2 21 03/19/2020   GLUCOSE 93 03/19/2020   BUN 13 03/19/2020   CREATININE 0.99 03/19/2020   BILITOT 0.5 03/19/2020   ALKPHOS 110 03/19/2020   AST 21 03/19/2020   ALT 26 03/19/2020   PROT 6.9  03/19/2020   ALBUMIN 4.9 03/19/2020   CALCIUM 10.7 (H) 03/19/2020   Lab Results  Component Value Date   CHOL 199 03/19/2020   Lab Results  Component Value Date   HDL 34 (L) 03/19/2020   Lab Results  Component Value Date   LDLCALC 133 (H) 03/19/2020   Lab Results  Component Value Date   TRIG 179 (H) 03/19/2020   Lab Results  Component Value Date   CHOLHDL 5.9 (H) 03/19/2020   No results found for: HGBA1C   .    Office Visit from 03/19/2020 in [facility]  PHQ-9 Total Score 13     GAD 7 : Generalized Anxiety Score 05/01/2020 03/19/2020  Nervous, Anxious, on Edge 3 3  Control/stop worrying 3 3  Worry too much - different things 0 3  Trouble relaxing 3 2  Restless 3 2  Easily annoyed or irritable 3 2  Afraid - awful might happen 0 1  Total GAD 7 Score 15 16   Anxiety Difficulty Somewhat difficult -    Assessment & Plan:   Problem List Items Addressed This Visit      Other   Generalized anxiety disorder    Generalized anxiety disorder is not well controlled with current medication.  BuSpar 7.5 mg tablet twice daily.  Patient stopped taking medication and preferred to get better on his own patient is planning to make changes to lifestyle that causes his anxiety. Provided education to patient with printed handouts given.  Advised patient to follow-up with worsening or unresolved symptoms.      Depression, recurrent (HCC) - Primary    Patient is following up for depression.  Patient reports he stopped taking medication due to nausea and did not like how antidepressants made him feel. Completed GAD-7, PHQ-9. Provided education to patient with printed handouts given.  Provided patient with counselor information.  Patient denies suicidal ideation today. Will follow up with primary care provider to complete [facility].  Advised patient to go to the ED with any suicidal ideations or panic attacks.          Follow-up: Return if symptoms worsen or fail to improve.    Daryll Drown, NP

## 2021-01-11 ENCOUNTER — Other Ambulatory Visit: Payer: Self-pay | Admitting: Family Medicine

## 2021-01-11 DIAGNOSIS — F411 Generalized anxiety disorder: Secondary | ICD-10-CM

## 2021-08-27 ENCOUNTER — Other Ambulatory Visit: Payer: Self-pay

## 2021-08-27 ENCOUNTER — Encounter: Payer: Self-pay | Admitting: Family Medicine

## 2021-08-27 ENCOUNTER — Ambulatory Visit (INDEPENDENT_AMBULATORY_CARE_PROVIDER_SITE_OTHER): Payer: 59 | Admitting: Family Medicine

## 2021-08-27 VITALS — BP 121/79 | HR 67 | Temp 98.0°F | Ht 69.0 in | Wt 147.2 lb

## 2021-08-27 DIAGNOSIS — B370 Candidal stomatitis: Secondary | ICD-10-CM

## 2021-08-27 MED ORDER — CLOTRIMAZOLE 10 MG MT TROC: 10.0000 mg | Freq: Every day | OROMUCOSAL | 2 refills | Status: DC

## 2021-08-27 NOTE — Progress Notes (Signed)
No chief complaint on file.   HPI  Patient presents today for 2 weeks of discoloration of the tongue. Two vesicles, one on each side of the tongue. Mild burning noted. No UR sx. No recent antibiotic.   PMH: Smoking status noted ROS: Per HPI  Objective: BP 121/79   Pulse 67   Temp 98 F (36.7 C)   Ht 5\' 9"  (1.753 m)   Wt 147 lb 3.2 oz (66.8 kg)   SpO2 99%   BMI 21.74 kg/m  Gen: NAD, alert, cooperative with exam HEENT: NCAT, EOMI, PERRL Mouth has grey plaque over the tongue. Small area of denuded epithelium at each side.  Assessment and plan:  1. Thrush, oral     Meds ordered this encounter  Medications   clotrimazole (MYCELEX) 10 MG troche    Sig: Take 1 tablet (10 mg total) by mouth 5 (five) times daily. For yeast in mouth and throat    Dispense:  35 tablet    Refill:  2    No orders of the defined types were placed in this encounter.   Follow up as needed.  , MD

## 2021-09-11 ENCOUNTER — Ambulatory Visit (INDEPENDENT_AMBULATORY_CARE_PROVIDER_SITE_OTHER): Payer: 59 | Admitting: Nurse Practitioner

## 2021-09-11 ENCOUNTER — Encounter: Payer: Self-pay | Admitting: Nurse Practitioner

## 2021-09-11 DIAGNOSIS — R1084 Generalized abdominal pain: Secondary | ICD-10-CM | POA: Diagnosis not present

## 2021-09-11 MED ORDER — ONDANSETRON HCL 4 MG PO TABS: 4.0000 mg | ORAL_TABLET | Freq: Three times a day (TID) | ORAL | 0 refills | Status: DC | PRN

## 2021-09-11 MED ORDER — IBUPROFEN 600 MG PO TABS: 600.0000 mg | ORAL_TABLET | Freq: Three times a day (TID) | ORAL | 0 refills | Status: DC | PRN

## 2021-09-11 NOTE — Progress Notes (Signed)
   Virtual Visit  Note Due to COVID-19 pandemic this visit was conducted virtually. This visit type was conducted due to national recommendations for restrictions regarding the COVID-19 Pandemic (e.g. social distancing, sheltering in place) in an effort to limit this patient's exposure and mitigate transmission in our community. All issues noted in this document were discussed and addressed.  A physical exam was not performed with this format.  I connected with Zachary Rodriguez on 09/11/21 at 11:10 am  by telephone and verified that I am speaking with the correct person using two identifiers. Zachary Rodriguez is currently located at home during visit. The provider, Daryll Drown, NP is located in their office at time of visit.  I discussed the limitations, risks, security and privacy concerns of performing an evaluation and management service by telephone and the availability of in person appointments. I also discussed with the patient that there may be a patient responsible charge related to this service. The patient expressed understanding and agreed to proceed.   History and Present Illness:  Abdominal Pain This is a recurrent problem. The current episode started more than 1 month ago. The onset quality is sudden. The problem occurs constantly. The problem has been unchanged. The pain is located in the generalized abdominal region. The pain is moderate. The quality of the pain is aching. The abdominal pain does not radiate. Associated symptoms include nausea. Pertinent negatives include no fever. Nothing aggravates the pain. The pain is relieved by Nothing. He has tried nothing for the symptoms. hernia repair surgery     Review of Systems  Constitutional:  Negative for chills, fever and malaise/fatigue.  HENT: Negative.    Cardiovascular: Negative.   Gastrointestinal:  Positive for abdominal pain and nausea.  Skin:  Negative for rash.  All other systems reviewed and are  negative.   Observations/Objective: Tele visit no signs of distress  Assessment and Plan: Take medication as prescribed -Zofran 4 mg by mouth  -Ibuprofen /tylenol for pain /fever completed GI referral  Follow Up Instructions: Follow up with unresolved symptoms    I discussed the assessment and treatment plan with the patient. The patient was provided an opportunity to ask questions and all were answered. The patient agreed with the plan and demonstrated an understanding of the instructions.   The patient was advised to call back or seek an in-person evaluation if the symptoms worsen or if the condition fails to improve as anticipated.  The above assessment and management plan was discussed with the patient. The patient verbalized understanding of and has agreed to the management plan. Patient is aware to call the clinic if symptoms persist or worsen. Patient is aware when to return to the clinic for a follow-up visit. Patient educated on when it is appropriate to go to the emergency department.   Time call ended:  11:18 am  I provided 8 minutes of  non face-to-face time during this encounter.    Daryll Drown, NP

## 2021-09-11 NOTE — Assessment & Plan Note (Signed)
Take medication as prescribed -Zofran 4 mg by mouth  -Ibuprofen /tylenol for pain /fever completed GI referral

## 2021-09-12 ENCOUNTER — Encounter (INDEPENDENT_AMBULATORY_CARE_PROVIDER_SITE_OTHER): Payer: Self-pay | Admitting: *Deleted

## 2021-10-10 ENCOUNTER — Encounter (INDEPENDENT_AMBULATORY_CARE_PROVIDER_SITE_OTHER): Payer: Self-pay | Admitting: Gastroenterology

## 2021-10-10 ENCOUNTER — Other Ambulatory Visit: Payer: Self-pay

## 2021-10-10 ENCOUNTER — Ambulatory Visit (INDEPENDENT_AMBULATORY_CARE_PROVIDER_SITE_OTHER): Payer: Medicaid Other | Admitting: Gastroenterology

## 2021-10-10 VITALS — BP 147/84 | HR 85 | Temp 98.9°F | Ht 69.5 in | Wt 147.6 lb

## 2021-10-10 DIAGNOSIS — R1013 Epigastric pain: Secondary | ICD-10-CM | POA: Insufficient documentation

## 2021-10-10 DIAGNOSIS — R1084 Generalized abdominal pain: Secondary | ICD-10-CM

## 2021-10-10 MED ORDER — SUCRALFATE 1 G PO TABS: 1.0000 g | ORAL_TABLET | Freq: Four times a day (QID) | ORAL | 1 refills | Status: DC

## 2021-10-10 MED ORDER — ONDANSETRON HCL 4 MG PO TABS: 4.0000 mg | ORAL_TABLET | Freq: Three times a day (TID) | ORAL | 1 refills | Status: DC | PRN

## 2021-10-10 NOTE — Progress Notes (Signed)
Referring Provider: Daryll Drown, NP Primary Care Physician:  Gwenlyn Fudge, FNP Primary GI Physician: newly established  Chief Complaint  Patient presents with   Abdominal Pain    Patient here today with complaints of mid epi gastric pain that radiates to the RUQ. He states he has a hernia located in the mid epigastric region and he is concerned he has injured it some how. He feels nauseated and unable to eat.    HPI:   Zachary Rodriguez is a 45 y.o. male with past medical history of abdominal hernia and elevated LDL cholesterol.   Patient presenting today as new patient referred by Lynnell Chad, NP for epigastric/RUQ pain with nausea.   He reports that he had ventral hernia surgery about 15 years ago, he states that he has had nausea ever since then on and off, however, He reports for the past 1-2 months he has had severe epigastric pain that radiates to his RUQ. He reports that he is very nauseated, will try to eat but typically he cannot eat much due to nausea. He has lost about 6 lbs in the past 2 months. He drinks boost at least 3 per day. He reports that eating makes his nausea worse, does not worsen his pain. He states that he is only able to eat about 1/4 of what he was able to. He is taking prevacid once daily x2 weeks, he reports that his reflux is well controlled on this but he is still having daily nausea and epigastric pain.  He denies any dysphagia or odynophagia. He is using zofran twice a day that his PCP gave him. He reports that this has helped some with his nausea so that he is able to eat some, however, he is almost out of this. No previous history of pancreatitis.   He endorses some constipation, however, usually has 1 BM per day. Occasional diarrhea, usually related to what he eats.  He denies any recent black stools or hematochezia. States that around 2013 he had some rectal bleeding, however, this subsided on its own. He has never had a colonoscopy.   NSAID use:  has been taking ibuprofen 800mg  TID for the past three weeks prescribed by his PCP, no NSAIDs prior to this Social hx: no etoh, tobacco 1.5 PPD Fam hx:no CRC or liver disease   Last Colonoscopy:n/a Last Endoscopy:n/a  Recommendations:  Due for Screening colonoscopy  Past Medical History:  Diagnosis Date   Abdominal hernia    Elevated LDL cholesterol level 03/20/2020    Past Surgical History:  Procedure Laterality Date   HERNIA REPAIR     MOUTH SURGERY      Current Outpatient Medications  Medication Sig Dispense Refill   ibuprofen (ADVIL) 600 MG tablet Take 1 tablet (600 mg total) by mouth every 8 (eight) hours as needed. 30 tablet 0   ondansetron (ZOFRAN) 4 MG tablet Take 1 tablet (4 mg total) by mouth every 8 (eight) hours as needed for nausea or vomiting. 20 tablet 0   No current facility-administered medications for this visit.    Allergies as of 10/10/2021   (No Known Allergies)    Family History  Problem Relation Age of Onset   COPD Paternal Grandfather    Emphysema Paternal Grandfather     Social History   Socioeconomic History   Marital status: Married    Spouse name: Not on file   Number of children: Not on file   Years of education: Not on  file   Highest education level: Not on file  Occupational History   Not on file  Tobacco Use   Smoking status: Every Day    Packs/day: 1.50    Types: Cigarettes   Smokeless tobacco: Never  Vaping Use   Vaping Use: Never used  Substance and Sexual Activity   Alcohol use: No   Drug use: Not Currently    Types: Marijuana    Comment: 03/19/20- states no drugs    Sexual activity: Not on file  Other Topics Concern   Not on file  Social History Narrative   Not on file   Social Determinants of Health   Financial Resource Strain: Not on file  Food Insecurity: Not on file  Transportation Needs: Not on file  Physical Activity: Not on file  Stress: Not on file  Social Connections: Not on file   Review of  systems General: negative for malaise, night sweats, fever, chills, +weight loss Neck: Negative for lumps, goiter, pain and significant neck swelling Resp: Negative for cough, wheezing, dyspnea at rest CV: Negative for chest pain, leg swelling, palpitations, orthopnea GI: denies melena, hematochezia,  dysphagia, odyonophagia, +unintentional weight loss, + nausea +epigastric pain +constipation +occasional diarrhea MSK: Negative for joint pain or swelling, back pain, and muscle pain. Derm: Negative for itching or rash Psych: Denies depression, anxiety, memory loss, confusion. No homicidal or suicidal ideation.  Heme: Negative for prolonged bleeding, bruising easily, and swollen nodes. Endocrine: Negative for cold or heat intolerance, polyuria, polydipsia and goiter. Neuro: negative for tremor, gait imbalance, syncope and seizures. The remainder of the review of systems is noncontributory.  Physical Exam: BP (!) 147/84 (BP Location: Left Arm, Patient Position: Sitting, Cuff Size: Small)   Pulse 85   Temp 98.9 F (37.2 C) (Oral)   Ht 5' 9.5" (1.765 m)   Wt 147 lb 9.6 oz (67 kg)   BMI 21.48 kg/m  General:   Alert and oriented. No distress noted. Pleasant and cooperative.  Head:  Normocephalic and atraumatic. Eyes:  Conjuctiva clear without scleral icterus. Mouth:  Oral mucosa pink and moist. Good dentition. No lesions. Heart: Normal rate and rhythm, s1 and s2 heart sounds present.  Lungs: Clear lung sounds in all lobes. Respirations equal and unlabored. Abdomen:  +BS, soft, and non-distended. No rebound. Severe ttp with light palpation of epigstric area, some guarding. No HSM or masses noted. Derm: No palmar erythema or jaundice Msk:  Symmetrical without gross deformities. Normal posture. Extremities:  Without edema. Neurologic:  Alert and  oriented x4 Psych:  Alert and cooperative. Normal mood and affect.  Invalid input(s): 6 MONTHS   ASSESSMENT: Zachary Rodriguez is a 45 y.o. male  presenting today for ongoing nausea and severe epigastric pain.  Nausea has been intermittent since ventral hernia surgery many years ago, however, severe epigastric pain with worsening nausea x1-2 months. No hx of pancreatitis, he does not take NSAIDs, or drink alcohol. Triglycerides mildly elevated in 2021, however, patient is a heavy tobacco smoker at 1.5 PPD which puts him at a higher risk for developing pancreatitis. Severe tenderness with palpation of epigastric region. We will check CBC, CMP and Lipase as he could have an ongoing case of acute pancreatitis, will do CT A/P w contrast for further evaluation, if CT imaging and labs are unremarkable will proceed with EGD for further eval. Low suspicion for PUD as he is not at high risk for this, however, he could have some esophagitis present. Zofran refilled to use PRN  Q8H for nausea. I will also send carafate 1g QID, if no results from carafate, patient was advised he can stop this. He should also discontinue extrat strength ibuprofen and avoid other NSAIDs. Continue doing boost shakes 3x/day and staying well hydrated.   We will discuss initial screening colonoscopy at next visit  PLAN:  Will refill zofran to use as needed q8h 2. Lipase, CBC, CMP CT Abdomen/Pelvis with contrast 3. Stop Ibuprofen 4. Stay well hydrated 5. Continue boost shakes 6. Discuss Scheduling initial screening colonoscopy at next office visit 7. Continue prevacid daily  8. Trial carafate, can stop if no relief of symptoms   Follow Up: 2 months  Fernandez Kenley L. Jeanmarie Hubert, MSN, APRN, AGNP-C Adult-Gerontology Nurse Practitioner [facility]

## 2021-10-10 NOTE — Patient Instructions (Addendum)
I have sent a refill of your zofran. Please continue to try and stay well hydrated and continue boost shakes for nutrition. Stop taking the ibuprofen. We will check some labs today and schedule you for CT scan of your abdomen, if CT does not show anything, we will proceed with an EGD for further evaluation of your symptoms.  We will discuss colonoscopy in the near future as you were due for you initial colon cancer screening at age 45   Follow up 2 month

## 2021-10-11 LAB — COMPREHENSIVE METABOLIC PANEL
AG Ratio: 2.2 (calc) (ref 1.0–2.5)
ALT: 20 U/L (ref 9–46)
AST: 16 U/L (ref 10–40)
Albumin: 4.6 g/dL (ref 3.6–5.1)
Alkaline phosphatase (APISO): 104 U/L (ref 36–130)
BUN: 13 mg/dL (ref 7–25)
CO2: 27 mmol/L (ref 20–32)
Calcium: 10.3 mg/dL (ref 8.6–10.3)
Chloride: 103 mmol/L (ref 98–110)
Creat: 1.02 mg/dL (ref 0.60–1.29)
Globulin: 2.1 g/dL (calc) (ref 1.9–3.7)
Glucose, Bld: 96 mg/dL (ref 65–99)
Potassium: 4.5 mmol/L (ref 3.5–5.3)
Sodium: 138 mmol/L (ref 135–146)
Total Bilirubin: 0.4 mg/dL (ref 0.2–1.2)
Total Protein: 6.7 g/dL (ref 6.1–8.1)

## 2021-10-11 LAB — CBC
HCT: 51 % — ABNORMAL HIGH (ref 38.5–50.0)
Hemoglobin: 16.7 g/dL (ref 13.2–17.1)
MCH: 27.5 pg (ref 27.0–33.0)
MCHC: 32.7 g/dL (ref 32.0–36.0)
MCV: 84 fL (ref 80.0–100.0)
MPV: 10.4 fL (ref 7.5–12.5)
Platelets: 286 10*3/uL (ref 140–400)
RBC: 6.07 10*6/uL — ABNORMAL HIGH (ref 4.20–5.80)
RDW: 14.3 % (ref 11.0–15.0)
WBC: 6.6 10*3/uL (ref 3.8–10.8)

## 2021-10-11 LAB — LIPASE: Lipase: 27 U/L (ref 7–60)

## 2021-10-15 ENCOUNTER — Ambulatory Visit (HOSPITAL_BASED_OUTPATIENT_CLINIC_OR_DEPARTMENT_OTHER)
Admission: RE | Admit: 2021-10-15 | Discharge: 2021-10-15 | Disposition: A | Discharge status: Home / Self Care | Payer: 59 | Source: Ambulatory Visit | Attending: Gastroenterology | Admitting: Gastroenterology

## 2021-10-15 ENCOUNTER — Other Ambulatory Visit: Payer: Self-pay

## 2021-10-15 ENCOUNTER — Encounter (HOSPITAL_BASED_OUTPATIENT_CLINIC_OR_DEPARTMENT_OTHER): Payer: Self-pay

## 2021-10-15 DIAGNOSIS — R1013 Epigastric pain: Secondary | ICD-10-CM | POA: Diagnosis present

## 2021-10-15 MED ORDER — IOHEXOL 300 MG/ML  SOLN
80.0000 mL | Freq: Once | INTRAMUSCULAR | Status: AC | PRN
  Administered 2021-10-15: 80 mL via INTRAVENOUS

## 2021-12-11 ENCOUNTER — Ambulatory Visit (INDEPENDENT_AMBULATORY_CARE_PROVIDER_SITE_OTHER): Payer: 59 | Admitting: Gastroenterology

## 2022-01-23 ENCOUNTER — Ambulatory Visit (INDEPENDENT_AMBULATORY_CARE_PROVIDER_SITE_OTHER): Payer: Self-pay | Admitting: Gastroenterology

## 2022-09-02 IMAGING — CT CT ABD-PELV W/ CM
2 of 5 series · 16 of 46 positions shown, 18 images · IV contrast (APPLIED)
Comparison: 04/20/2012 CT abdomen/pelvis.

CLINICAL DATA: Severe epigastric pain radiating to the right upper
quadrant for 2 months. Nausea. Ventral hernia surgery 15 years
prior.

EXAM:
CT ABDOMEN AND PELVIS WITH CONTRAST
TECHNIQUE: Multidetector CT imaging of the abdomen and pelvis was performed
using the standard protocol following bolus administration of
intravenous contrast.
CONTRAST:  80mL OMNIPAQUE IOHEXOL 300 MG/ML  SOLN

[Series 2: abd pel w · axial · 0.75mm/px · z∈[-543,-138]mm · 13 of 91 slices shown, 15 images]
[im 5/91  soft-tissue]
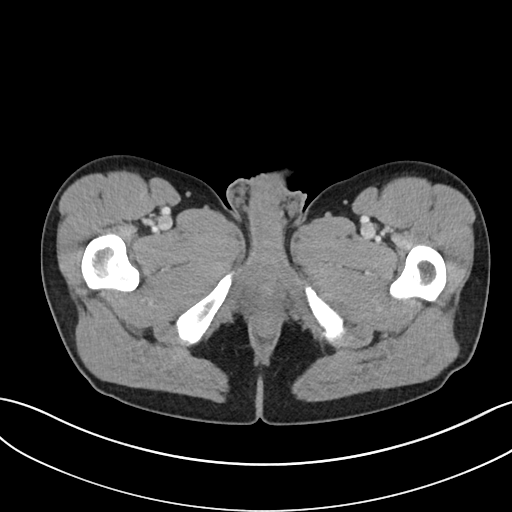
[im 5/91  bone]
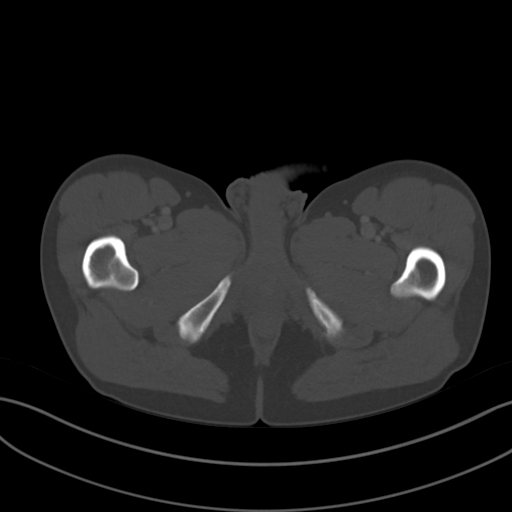
[im 14/91  soft-tissue]
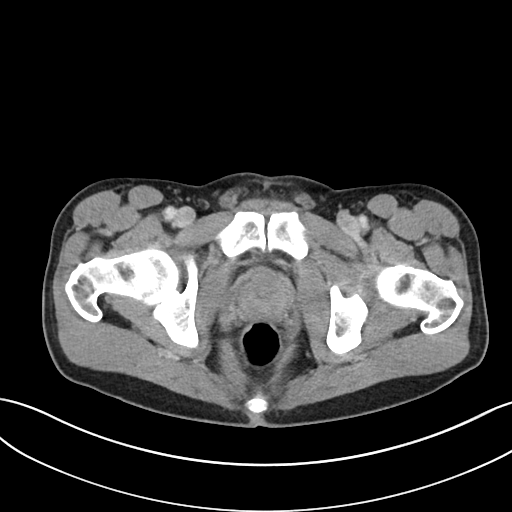
[im 19/91  soft-tissue]
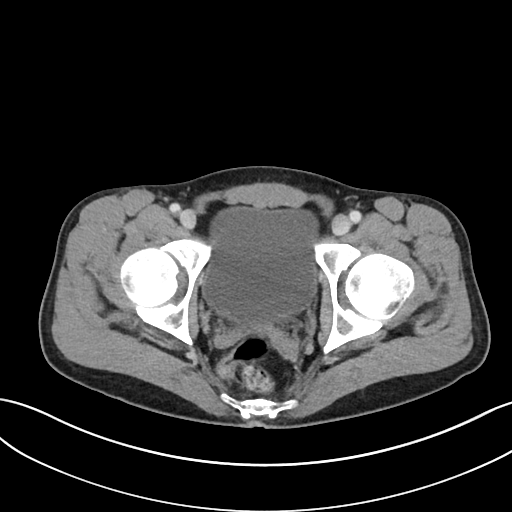
[im 28/91  soft-tissue]
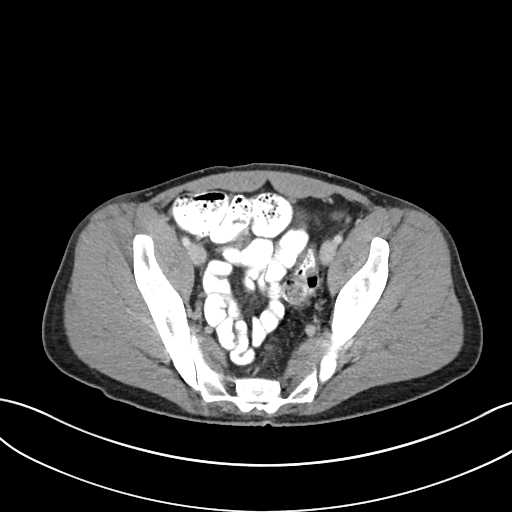
[im 32/91  soft-tissue]
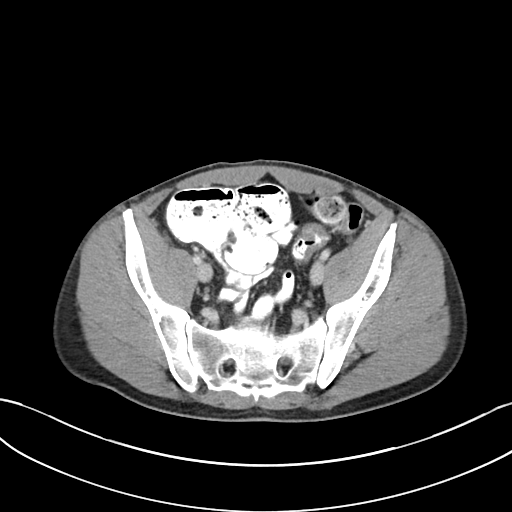
[im 41/91  soft-tissue]
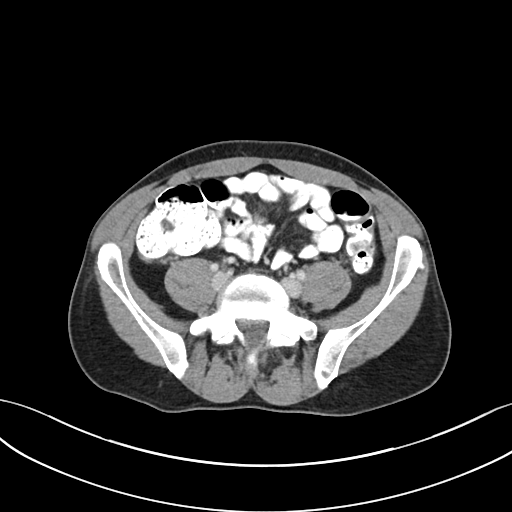
[im 46/91  soft-tissue]
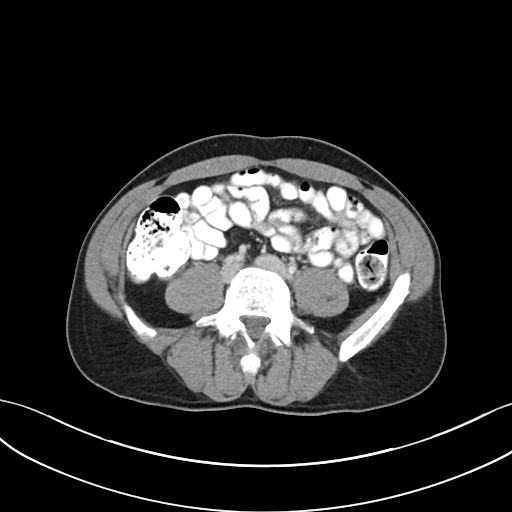
[im 50/91  soft-tissue]
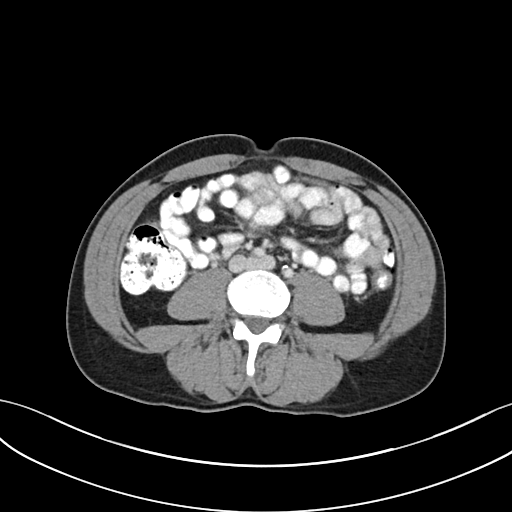
[im 59/91  soft-tissue]
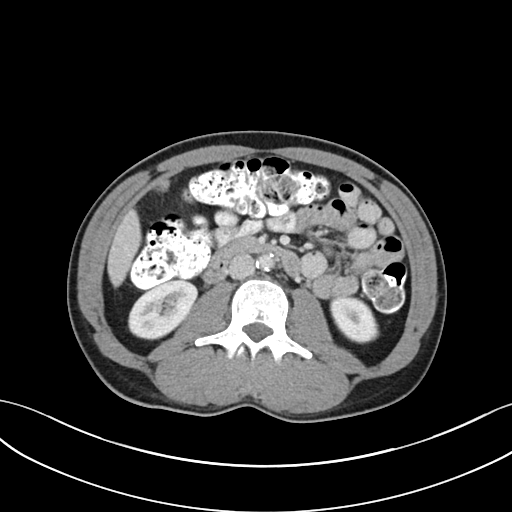
[im 59/91  bone]
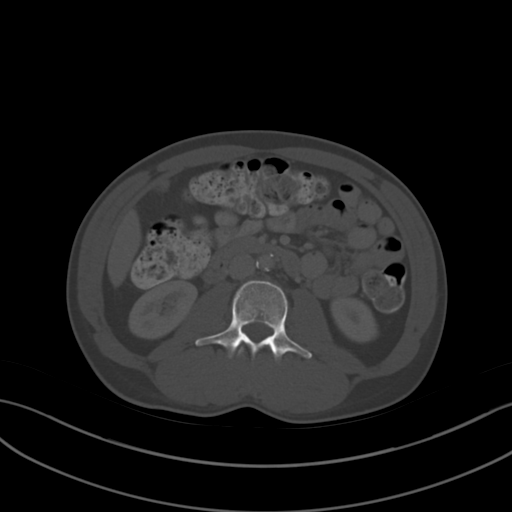
[im 64/91  soft-tissue]
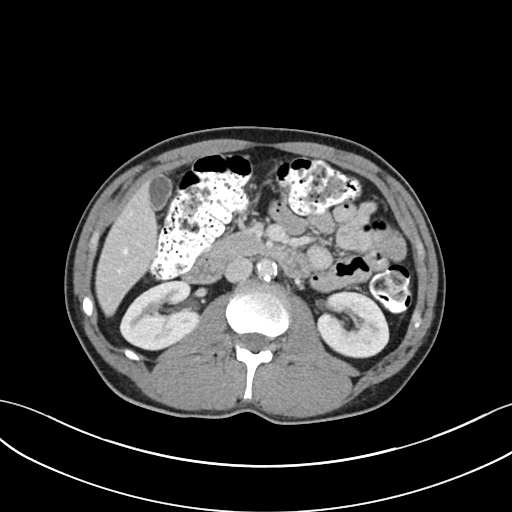
[im 73/91  soft-tissue]
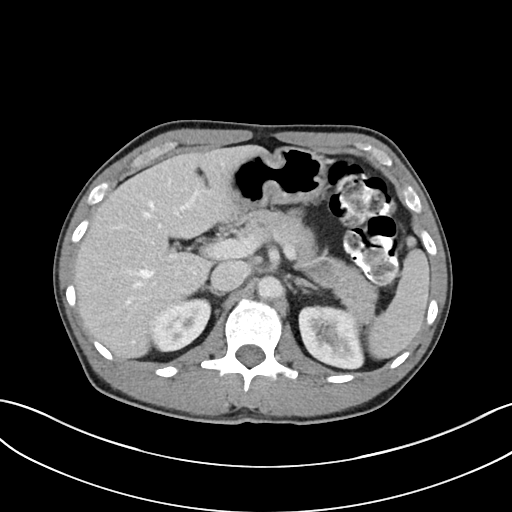
[im 77/91  soft-tissue]
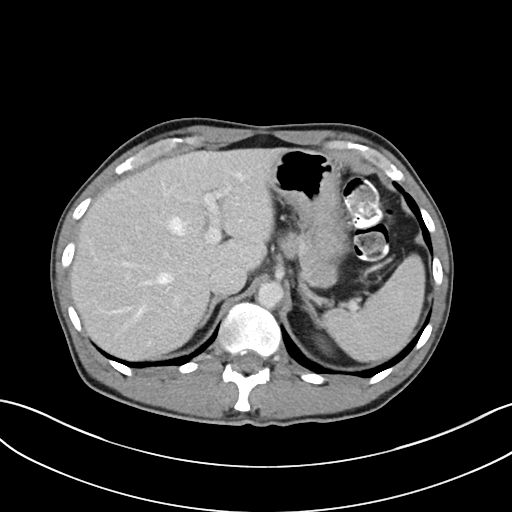
[im 86/91  soft-tissue]
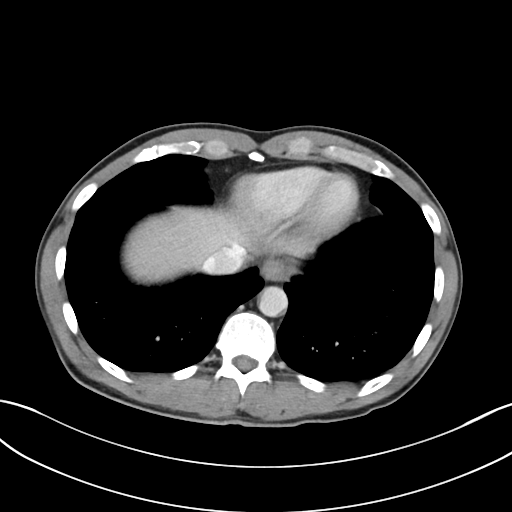

[Series 5: coronal · coronal · 0.69mm/px · 3 of 81 slices shown]
[im 27/81  soft-tissue]
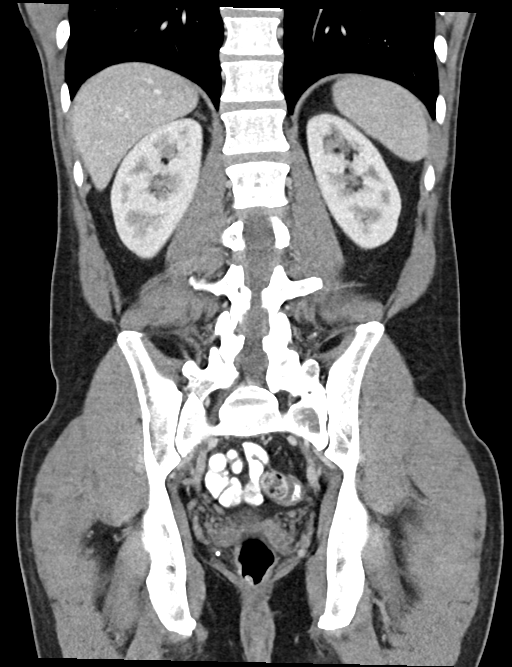
[im 36/81  soft-tissue]
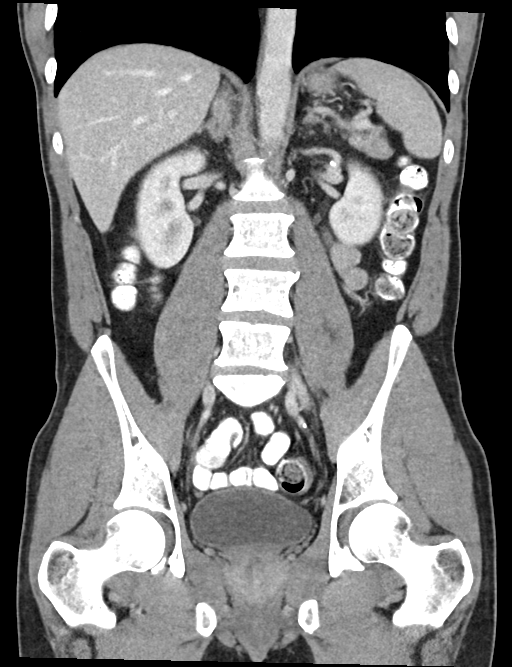
[im 45/81  soft-tissue]
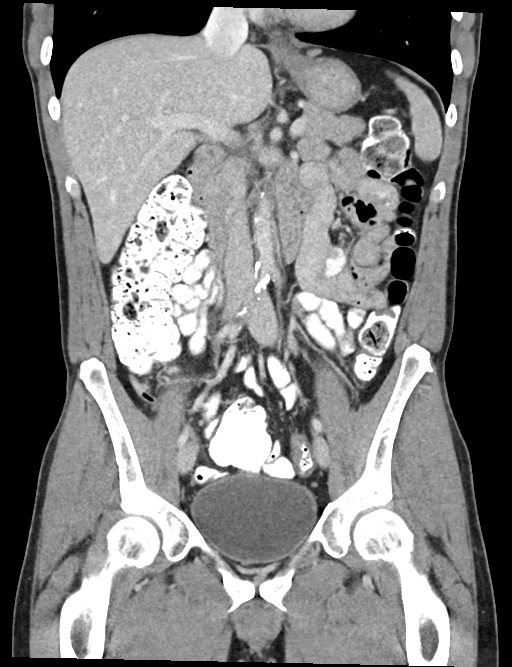

[16 of 46 positions shown; findings below may reference images not displayed]

FINDINGS: Lower chest: No significant pulmonary nodules or acute consolidative
airspace disease.

Hepatobiliary: Normal liver size. No liver mass. Normal gallbladder
with no radiopaque cholelithiasis. No biliary ductal dilatation.

Pancreas: Normal, with no mass or duct dilation.

Spleen: Normal size. No mass.

Adrenals/Urinary Tract: Normal adrenals. Normal kidneys with no
hydronephrosis and no renal mass. Normal bladder.

Stomach/Bowel: Normal non-distended stomach. Normal caliber small
bowel with no small bowel wall thickening. Normal appendix. Oral
contrast transits to the distal colon. Normal large bowel with no
diverticulosis, large bowel wall thickening or pericolonic fat
stranding.

Vascular/Lymphatic: Atherosclerotic nonaneurysmal abdominal aorta.
Patent portal, splenic, hepatic and renal veins. No pathologically
enlarged lymph nodes in the abdomen or pelvis.

Reproductive: Normal size prostate.

Other: No pneumoperitoneum, ascites or focal fluid collection. No
evidence of a recurrent ventral abdominal hernia.

Musculoskeletal: No aggressive appearing focal osseous lesions. Mild
thoracolumbar spondylosis.
IMPRESSION: 1. No acute abnormality. No evidence of bowel obstruction or acute
bowel inflammation. Normal appendix. No evidence of recurrent
ventral abdominal hernia.
2. Aortic Atherosclerosis ([id number]).

## 2024-05-23 ENCOUNTER — Ambulatory Visit: Payer: Self-pay

## 2024-05-23 ENCOUNTER — Emergency Department (HOSPITAL_COMMUNITY)

## 2024-05-23 ENCOUNTER — Encounter (HOSPITAL_COMMUNITY): Payer: Self-pay

## 2024-05-23 ENCOUNTER — Inpatient Hospital Stay (HOSPITAL_COMMUNITY)
Admission: EM | Admit: 2024-05-23 | Discharge: 2024-05-26 | DRG: 253 | Disposition: A | Discharge status: Home / Self Care | Attending: Student | Admitting: Student

## 2024-05-23 ENCOUNTER — Other Ambulatory Visit: Payer: Self-pay

## 2024-05-23 DIAGNOSIS — I7092 Chronic total occlusion of artery of the extremities: Secondary | ICD-10-CM | POA: Diagnosis present

## 2024-05-23 DIAGNOSIS — Z7902 Long term (current) use of antithrombotics/antiplatelets: Secondary | ICD-10-CM | POA: Diagnosis not present

## 2024-05-23 DIAGNOSIS — F411 Generalized anxiety disorder: Secondary | ICD-10-CM | POA: Diagnosis present

## 2024-05-23 DIAGNOSIS — E785 Hyperlipidemia, unspecified: Secondary | ICD-10-CM | POA: Diagnosis present

## 2024-05-23 DIAGNOSIS — D72829 Elevated white blood cell count, unspecified: Secondary | ICD-10-CM | POA: Diagnosis present

## 2024-05-23 DIAGNOSIS — I10 Essential (primary) hypertension: Secondary | ICD-10-CM | POA: Diagnosis present

## 2024-05-23 DIAGNOSIS — I700 Atherosclerosis of aorta: Secondary | ICD-10-CM | POA: Diagnosis present

## 2024-05-23 DIAGNOSIS — Z825 Family history of asthma and other chronic lower respiratory diseases: Secondary | ICD-10-CM | POA: Diagnosis not present

## 2024-05-23 DIAGNOSIS — J439 Emphysema, unspecified: Secondary | ICD-10-CM | POA: Diagnosis present

## 2024-05-23 DIAGNOSIS — I743 Embolism and thrombosis of arteries of the lower extremities: Secondary | ICD-10-CM | POA: Diagnosis present

## 2024-05-23 DIAGNOSIS — Z7901 Long term (current) use of anticoagulants: Secondary | ICD-10-CM | POA: Diagnosis not present

## 2024-05-23 DIAGNOSIS — I70212 Atherosclerosis of native arteries of extremities with intermittent claudication, left leg: Secondary | ICD-10-CM | POA: Diagnosis not present

## 2024-05-23 DIAGNOSIS — F17210 Nicotine dependence, cigarettes, uncomplicated: Secondary | ICD-10-CM | POA: Insufficient documentation

## 2024-05-23 DIAGNOSIS — R008 Other abnormalities of heart beat: Secondary | ICD-10-CM | POA: Diagnosis not present

## 2024-05-23 DIAGNOSIS — F339 Major depressive disorder, recurrent, unspecified: Secondary | ICD-10-CM | POA: Diagnosis present

## 2024-05-23 DIAGNOSIS — I70202 Unspecified atherosclerosis of native arteries of extremities, left leg: Secondary | ICD-10-CM | POA: Diagnosis not present

## 2024-05-23 DIAGNOSIS — Z716 Tobacco abuse counseling: Secondary | ICD-10-CM | POA: Diagnosis not present

## 2024-05-23 DIAGNOSIS — Z79899 Other long term (current) drug therapy: Secondary | ICD-10-CM | POA: Diagnosis not present

## 2024-05-23 DIAGNOSIS — F32A Depression, unspecified: Secondary | ICD-10-CM | POA: Diagnosis present

## 2024-05-23 DIAGNOSIS — I70222 Atherosclerosis of native arteries of extremities with rest pain, left leg: Principal | ICD-10-CM | POA: Diagnosis present

## 2024-05-23 DIAGNOSIS — R7303 Prediabetes: Secondary | ICD-10-CM | POA: Diagnosis present

## 2024-05-23 DIAGNOSIS — G8929 Other chronic pain: Secondary | ICD-10-CM | POA: Diagnosis present

## 2024-05-23 DIAGNOSIS — I739 Peripheral vascular disease, unspecified: Principal | ICD-10-CM

## 2024-05-23 DIAGNOSIS — F418 Other specified anxiety disorders: Secondary | ICD-10-CM | POA: Diagnosis not present

## 2024-05-23 LAB — COMPREHENSIVE METABOLIC PANEL WITH GFR
ALT: 17 U/L (ref 0–44)
AST: 18 U/L (ref 15–41)
Albumin: 4.5 g/dL (ref 3.5–5.0)
Alkaline Phosphatase: 74 U/L (ref 38–126)
Anion gap: 11 (ref 5–15)
BUN: 19 mg/dL (ref 6–20)
CO2: 19 mmol/L — ABNORMAL LOW (ref 22–32)
Calcium: 10 mg/dL (ref 8.9–10.3)
Chloride: 107 mmol/L (ref 98–111)
Creatinine, Ser: 1.01 mg/dL (ref 0.61–1.24)
GFR, Estimated: >60 mL/min (ref >60)
Glucose, Bld: 130 mg/dL — ABNORMAL HIGH (ref 70–99)
Potassium: 3.7 mmol/L (ref 3.5–5.1)
Sodium: 137 mmol/L (ref 135–145)
Total Bilirubin: 1.2 mg/dL (ref 0.0–1.2)
Total Protein: 7.3 g/dL (ref 6.5–8.1)

## 2024-05-23 LAB — CBC WITH DIFFERENTIAL/PLATELET
Abs Immature Granulocytes: 0.04 10*3/uL (ref 0.00–0.07)
Basophils Absolute: 0.1 10*3/uL (ref 0.0–0.1)
Basophils Relative: 0 %
Eosinophils Absolute: 0 10*3/uL (ref 0.0–0.5)
Eosinophils Relative: 0 %
HCT: 49.9 % (ref 39.0–52.0)
Hemoglobin: 17 g/dL (ref 13.0–17.0)
Immature Granulocytes: 0 %
Lymphocytes Relative: 20 %
Lymphs Abs: 2.5 10*3/uL (ref 0.7–4.0)
MCH: 27.9 pg (ref 26.0–34.0)
MCHC: 34.1 g/dL (ref 30.0–36.0)
MCV: 81.9 fL (ref 80.0–100.0)
Monocytes Absolute: 1.3 10*3/uL — ABNORMAL HIGH (ref 0.1–1.0)
Monocytes Relative: 10 %
Neutro Abs: 8.8 10*3/uL — ABNORMAL HIGH (ref 1.7–7.7)
Neutrophils Relative %: 70 %
Platelets: 302 10*3/uL (ref 150–400)
RBC: 6.09 MIL/uL — ABNORMAL HIGH (ref 4.22–5.81)
RDW: 14.6 % (ref 11.5–15.5)
WBC: 12.7 10*3/uL — ABNORMAL HIGH (ref 4.0–10.5)
nRBC: 0 % (ref 0.0–0.2)

## 2024-05-23 LAB — I-STAT CHEM 8, ED
BUN: 22 mg/dL — ABNORMAL HIGH (ref 6–20)
BUN: 20 mg/dL (ref 6–20)
Calcium, Ion: 1.14 mmol/L — ABNORMAL LOW (ref 1.15–1.40)
Calcium, Ion: 1.18 mmol/L (ref 1.15–1.40)
Chloride: 106 mmol/L (ref 98–111)
Chloride: 107 mmol/L (ref 98–111)
Creatinine, Ser: 1.1 mg/dL (ref 0.61–1.24)
Creatinine, Ser: 1.1 mg/dL (ref 0.61–1.24)
Glucose, Bld: 134 mg/dL — ABNORMAL HIGH (ref 70–99)
Glucose, Bld: 109 mg/dL — ABNORMAL HIGH (ref 70–99)
HCT: 51 % (ref 39.0–52.0)
HCT: 51 % (ref 39.0–52.0)
Hemoglobin: 17.3 g/dL — ABNORMAL HIGH (ref 13.0–17.0)
Hemoglobin: 17.3 g/dL — ABNORMAL HIGH (ref 13.0–17.0)
Potassium: 3.8 mmol/L (ref 3.5–5.1)
Potassium: 3.8 mmol/L (ref 3.5–5.1)
Sodium: 139 mmol/L (ref 135–145)
Sodium: 140 mmol/L (ref 135–145)
TCO2: 21 mmol/L — ABNORMAL LOW (ref 22–32)
TCO2: 21 mmol/L — ABNORMAL LOW (ref 22–32)

## 2024-05-23 LAB — PROTIME-INR
INR: 1 (ref 0.8–1.2)
Prothrombin Time: 13.7 s (ref 11.4–15.2)

## 2024-05-23 LAB — APTT: aPTT: 35 s (ref 24–36)

## 2024-05-23 MED ORDER — HEPARIN BOLUS VIA INFUSION
5000.0000 [IU] | Freq: Once | INTRAVENOUS | Status: AC
  Administered 2024-05-23: 5000 [IU] via INTRAVENOUS
  Filled 2024-05-23: qty 5000

## 2024-05-23 MED ORDER — ACETAMINOPHEN 650 MG RE SUPP: 650.0000 mg | Freq: Four times a day (QID) | RECTAL | Status: DC | PRN

## 2024-05-23 MED ORDER — IOHEXOL 350 MG/ML SOLN
75.0000 mL | Freq: Once | INTRAVENOUS | Status: AC | PRN
  Administered 2024-05-23: 75 mL via INTRAVENOUS

## 2024-05-23 MED ORDER — IPRATROPIUM-ALBUTEROL 0.5-2.5 (3) MG/3ML IN SOLN: 3.0000 mL | RESPIRATORY_TRACT | Status: DC | PRN

## 2024-05-23 MED ORDER — SODIUM CHLORIDE 0.9 % IV SOLN: 250.0000 mL | INTRAVENOUS | Status: AC | PRN

## 2024-05-23 MED ORDER — NICOTINE 14 MG/24HR TD PT24
14.0000 mg | MEDICATED_PATCH | Freq: Every day | TRANSDERMAL | Status: DC
  Administered 2024-05-24 – 2024-05-26 (×2): 14 mg via TRANSDERMAL
  Filled 2024-05-23 (×3): qty 1

## 2024-05-23 MED ORDER — ATORVASTATIN CALCIUM 40 MG PO TABS
40.0000 mg | ORAL_TABLET | Freq: Once | ORAL | Status: AC
  Administered 2024-05-23: 40 mg via ORAL
  Filled 2024-05-23: qty 1

## 2024-05-23 MED ORDER — HEPARIN (PORCINE) 25000 UT/250ML-% IV SOLN
1200.0000 [IU]/h | INTRAVENOUS | Status: DC
  Administered 2024-05-23: 1150 [IU]/h via INTRAVENOUS
  Administered 2024-05-24: 1200 [IU]/h via INTRAVENOUS
  Filled 2024-05-23 (×3): qty 250

## 2024-05-23 MED ORDER — ASPIRIN 81 MG PO TBEC: 81.0000 mg | DELAYED_RELEASE_TABLET | Freq: Every day | ORAL | Status: DC

## 2024-05-23 MED ORDER — IOHEXOL 350 MG/ML SOLN
100.0000 mL | Freq: Once | INTRAVENOUS | Status: AC | PRN
  Administered 2024-05-23: 100 mL via INTRAVENOUS

## 2024-05-23 MED ORDER — ONDANSETRON HCL 4 MG/2ML IJ SOLN: 4.0000 mg | Freq: Four times a day (QID) | INTRAMUSCULAR | Status: DC | PRN

## 2024-05-23 MED ORDER — ONDANSETRON HCL 4 MG PO TABS: 4.0000 mg | ORAL_TABLET | Freq: Four times a day (QID) | ORAL | Status: DC | PRN

## 2024-05-23 MED ORDER — ACETAMINOPHEN 325 MG PO TABS
650.0000 mg | ORAL_TABLET | Freq: Four times a day (QID) | ORAL | Status: DC | PRN
  Administered 2024-05-25: 650 mg via ORAL
  Filled 2024-05-23: qty 2

## 2024-05-23 MED ORDER — PANTOPRAZOLE SODIUM 40 MG PO TBEC
40.0000 mg | DELAYED_RELEASE_TABLET | Freq: Every day | ORAL | Status: DC
  Administered 2024-05-23 – 2024-05-26 (×3): 40 mg via ORAL
  Filled 2024-05-23 (×3): qty 1

## 2024-05-23 MED ORDER — ASPIRIN 81 MG PO TBEC
81.0000 mg | DELAYED_RELEASE_TABLET | Freq: Every day | ORAL | Status: DC
  Administered 2024-05-23 – 2024-05-26 (×3): 81 mg via ORAL
  Filled 2024-05-23 (×3): qty 1

## 2024-05-23 MED ORDER — SODIUM CHLORIDE 0.9% FLUSH
3.0000 mL | Freq: Two times a day (BID) | INTRAVENOUS | Status: DC
  Administered 2024-05-23 – 2024-05-25 (×3): 3 mL via INTRAVENOUS

## 2024-05-23 MED ORDER — ATORVASTATIN CALCIUM 80 MG PO TABS
80.0000 mg | ORAL_TABLET | Freq: Every day | ORAL | Status: DC
  Administered 2024-05-24 – 2024-05-26 (×2): 80 mg via ORAL
  Filled 2024-05-23 (×2): qty 1

## 2024-05-23 MED ORDER — HYDROXYZINE HCL 25 MG PO TABS
25.0000 mg | ORAL_TABLET | Freq: Three times a day (TID) | ORAL | Status: DC | PRN
  Administered 2024-05-23 – 2024-05-25 (×2): 25 mg via ORAL
  Filled 2024-05-23 (×2): qty 1

## 2024-05-23 MED ORDER — SODIUM CHLORIDE 0.9% FLUSH: 3.0000 mL | INTRAVENOUS | Status: DC | PRN

## 2024-05-23 NOTE — ED Triage Notes (Signed)
 Patient c/o left leg pain after short distances with burning down his leg and numbness in his foot. Wife reports cooler on the left than the right as well. PA and RN unable to palpate pulse in triage, very faint pulse possibly heard with doppler.

## 2024-05-23 NOTE — ED Provider Triage Note (Signed)
 Emergency Medicine Provider Triage Evaluation Note  Zachary Rodriguez , a 48 y.o. male  was evaluated in triage.  Pt complains of left leg pain, onset 3 weeks ago, worsening. States no pain at rest, then develops severe pain in the left groin to foot after taking a few steps. No injury  Review of Systems  Positive:  Negative:   Physical Exam  BP (!) 161/94 (BP Location: Right Arm)   Pulse (!) 120   Temp 98.4 F (36.9 C)   Resp 16   Ht 5' 9.5" (1.765 m)   Wt 63.5 kg   SpO2 99%   BMI 20.38 kg/m  Gen:   Awake, no distress   Resp:  Normal effort  MSK:   Moves extremities without difficulty  Other:  Unable to palpated pedal pulse in LLE, weak DP pulse right foot. ? Pulse with doppler to PT on left.   Medical Decision Making  Medically screening exam initiated at 2:35 PM.  Appropriate orders placed.  Zachary Rodriguez was informed that the remainder of the evaluation will be completed by another provider, this initial triage assessment does not replace that evaluation, and the importance of remaining in the ED until their evaluation is complete.     Darlis Eisenmenger, PA-C 05/23/24 1437

## 2024-05-23 NOTE — Plan of Care (Signed)
 Patient reported he takes BuSpar  as needed at home.  However unable to find any record on the chart of previous BuSpar  and unsure about the dosing.  Also BuSpar  is not a as needed medication.  He has history of generalized anxiety disorder, and chronic depression however not taking any medication currently but currently requesting for BuSpar .  I have consulted psychiatry to evaluate for GAD and depression. - Starting Atarax as needed for manage of anxiety.

## 2024-05-23 NOTE — ED Notes (Signed)
 Patient transported to CT

## 2024-05-23 NOTE — ED Notes (Signed)
 Notified lab istat collected

## 2024-05-23 NOTE — Progress Notes (Signed)
 ANTICOAGULATION CONSULT NOTE  Pharmacy Consult for Heparin Indication: limb ischemia  No Known Allergies  Patient Measurements: Height: 5' 9.5" (176.5 cm) Weight: 63.5 kg (140 lb) IBW/kg (Calculated) : 71.85 Heparin Dosing Weight: 63.5 kg  Vital Signs: Temp: 98.2 F (36.8 C) (06/09 2020) Temp Source: Oral (06/09 2020) BP: 157/90 (06/09 2020) Pulse Rate: 89 (06/09 2020)  Labs: Recent Labs    05/23/24 1437 05/23/24 1502 05/23/24 1625 05/23/24 1631  HGB 17.0 17.3*  --  17.3*  HCT 49.9 51.0  --  51.0  PLT 302  --   --   --   APTT  --   --  35  --   LABPROT  --   --  13.7  --   INR  --   --  1.0  --   CREATININE 1.01 1.10  --  1.10    Estimated Creatinine Clearance: 73 mL/min (by C-G formula based on SCr of 1.1 mg/dL).   Medical History: Past Medical History:  Diagnosis Date   Abdominal hernia    Elevated LDL cholesterol level 03/20/2020    Medications:  (Not in a hospital admission)  Scheduled:   aspirin EC  81 mg Oral Daily   Infusions:  PRN:   Assessment: 48 yom with a history of anxiety, depression, HLD. Patient is presenting with leg pain x 3 weeks. Heparin per pharmacy consult placed for limb ischemia.  Patient is not on anticoagulation prior to arrival.  Hgb 17.3; plt 302  Goal of Therapy:  Heparin level 0.3-0.7 units/ml Monitor platelets by anticoagulation protocol: Yes   Plan:  Give IV heparin 5000 units bolus x 1 Start heparin infusion at 1150 units/hr Check anti-Xa level in 6 hours and daily while on heparin Continue to monitor H&H and platelets  Dionicio Fray, PharmD, BCPS 05/23/2024 8:39 PM ED Clinical Pharmacist -  [phone]

## 2024-05-23 NOTE — ED Provider Notes (Signed)
 [facility] Provider Note   CSN: [id number] Arrival date & time: 05/23/24  1414     History Chief Complaint  Patient presents with   Leg Pain    Zachary Rodriguez is a 48 y.o. male w/ PMHx anxiety depression, hyperlipidemia who presents to the ED for evaluation of left leg pain for the past 3 weeks.  Patient states pain is worsening.  Patient reports pain is most significant after walking and significantly proved with rest.  Patient denies any trauma.  No history of DVT.  Remote tobacco history.  Patient states he has history of sciatica but this pain feels significantly different.  No abdominal pain chest pain shortness of breath.   Physical Exam Updated Vital Signs BP (!) 157/90 (BP Location: Right Arm)   Pulse 89   Temp 98.2 F (36.8 C) (Oral)   Resp 12   Ht 5' 9.5" (1.765 m)   Wt 63.5 kg   SpO2 100%   BMI 20.38 kg/m  Physical Exam Vitals and nursing note reviewed.  Constitutional:      General: He is not in acute distress.    Appearance: He is well-developed.  Cardiovascular:     Rate and Rhythm: Normal rate and regular rhythm.     Heart sounds: No murmur heard.    Comments: Faint irregular DP pulse on the left foot.  Left foot also slightly cooler than right.  Delayed cap refill left foot. Pulmonary:     Effort: Pulmonary effort is normal. No respiratory distress.     Breath sounds: Normal breath sounds.  Musculoskeletal:        General: No swelling, deformity or signs of injury.  Skin:    General: Skin is warm and dry.  Neurological:     Mental Status: He is alert.  Psychiatric:        Mood and Affect: Mood normal.     ED Results / Procedures / Treatments   Labs (all labs ordered are listed, but only abnormal results are displayed) Labs Reviewed  CBC WITH DIFFERENTIAL/PLATELET - Abnormal; Notable for the following components:      Result Value   WBC 12.7 (*)    RBC 6.09 (*)    Neutro Abs 8.8 (*)    Monocytes  Absolute 1.3 (*)    All other components within normal limits  COMPREHENSIVE METABOLIC PANEL WITH GFR - Abnormal; Notable for the following components:   CO2 19 (*)    Glucose, Bld 130 (*)    All other components within normal limits  I-STAT CHEM 8, ED - Abnormal; Notable for the following components:   BUN 22 (*)    Glucose, Bld 134 (*)    Calcium, Ion 1.14 (*)    TCO2 21 (*)    Hemoglobin 17.3 (*)    All other components within normal limits  I-STAT CHEM 8, ED - Abnormal; Notable for the following components:   Glucose, Bld 109 (*)    TCO2 21 (*)    Hemoglobin 17.3 (*)    All other components within normal limits  PROTIME-INR  APTT  HEPARIN LEVEL (UNFRACTIONATED)  LIPID PANEL  HEMOGLOBIN A1C  COMPREHENSIVE METABOLIC PANEL WITH GFR  CBC  PROTIME-INR  APTT  HIV ANTIBODY (ROUTINE TESTING W REFLEX)    EKG None  Radiology CT ANGIO CHEST AORTA W/CM & OR WO/CM Result Date: 05/23/2024 CLINICAL DATA:  Arterial embolism suspected, lower extremity, determine source EXAM: CT ANGIOGRAPHY CHEST WITHOUT AND  WITH CONTRAST TECHNIQUE: Multidetector CT imaging of the chest was performed using the standard protocol before and during bolus administration of intravenous contrast. Multiplanar CT image reconstructions and MIPs were obtained to evaluate the vascular anatomy. RADIATION DOSE REDUCTION: This exam was performed according to the departmental dose-optimization program which includes automated exposure control, adjustment of the mA and/or kV according to patient size and/or use of iterative reconstruction technique. CONTRAST:  75mL OMNIPAQUE  IOHEXOL  350 MG/ML SOLN COMPARISON:  None Available. FINDINGS: Cardiovascular: Normal caliber thoracic aorta without intramural hematoma, penetrating atherosclerotic ulcer, or dissection. Irregular mixed density atherosclerotic plaque in the descending thoracic aorta. No ulcerated plaque. Normal heart size. No pericardial effusion. Negative for pulmonary  embolism. Mediastinum/Nodes: Trachea and esophagus are unremarkable. No thoracic adenopathy. Lungs/Pleura: Centrilobular and paraseptal emphysema greatest in the upper lobes. Hypoventilation changes in the lower lobes. No focal consolidation, pleural effusion, or pneumothorax. Upper Abdomen: No acute abnormality. Musculoskeletal: No acute fracture. Review of the MIP images confirms the above findings. IMPRESSION: 1. No acute abnormality in the chest. 2. Aortic Atherosclerosis (ICD10-I70.0). Mild irregular mixed density plaque in the descending aorta. No penetrating atherosclerotic ulcer or ulcerated plaque. 3. Emphysema (ICD10-J43.9). Electronically Signed   By: Rozell Cornet M.D.   On: 05/23/2024 20:05   CT Angio Aortobifemoral W and/or Wo Contrast Result Date: 05/23/2024 CLINICAL DATA:  Claudication EXAM: CT ANGIOGRAPHY OF ABDOMINAL AORTA WITH ILIOFEMORAL RUNOFF TECHNIQUE: Multidetector CT imaging of the abdomen, pelvis and lower extremities was performed using the standard protocol during bolus administration of intravenous contrast. Multiplanar CT image reconstructions and MIPs were obtained to evaluate the vascular anatomy. RADIATION DOSE REDUCTION: This exam was performed according to the departmental dose-optimization program which includes automated exposure control, adjustment of the mA and/or kV according to patient size and/or use of iterative reconstruction technique. CONTRAST:  OMNIPAQUE  IOHEXOL  350 MG/ML SOLN COMPARISON:  None Available. FINDINGS: VASCULAR Aorta: Irregular calcified and noncalcified plaque throughout the aorta. No aneurysm or dissection. Celiac: Patent without evidence of aneurysm, dissection, vasculitis or significant stenosis. SMA: Patent without evidence of aneurysm, dissection, vasculitis or significant stenosis. Renals: Both renal arteries are patent without evidence of aneurysm, dissection, vasculitis, fibromuscular dysplasia or significant stenosis. IMA: Patent without  evidence of aneurysm, dissection, vasculitis or significant stenosis. RIGHT Lower Extremity Inflow: Calcified and noncalcified plaque throughout the common iliac arteries. External iliac artery and internal iliac artery widely patent. Outflow: Calcified plaque in the common femoral artery. No significant stenosis. Superficial and deep femoral arteries widely patent. Popliteal artery widely patent. Runoff: Three-vessel runoff to the ankle with dominant runoff the posterior tibial artery. Dorsalis pedis/anterior tibial artery is diminutive at the level of the ankle. LEFT Lower Extremity Inflow: Calcified and noncalcified plaque throughout the common iliac artery. No significant stenosis. External iliac and internal iliac arteries widely patent. Outflow: Occlusion of the left common femoral artery. Reconstitution at the common femoral artery bifurcation with patent superficial and deep femoral arteries. Linear filling defect noted in the proximal superficial femoral artery, likely extension of thrombus from the occlusive thrombus in the common femoral artery. Mid and distal superficial femoral artery widely patent. Popliteal artery widely patent. Runoff: Three-vessel runoff to the ankle with the dominant vessel being the posterior tibial artery. Veins: No obvious venous abnormality within the limitations of this arterial phase study. Review of the MIP images confirms the above findings. NON-VASCULAR Lower chest: Ground-glass opacities dependently in the lower lobes, likely dependent atelectasis. No effusions. Hepatobiliary: No focal hepatic abnormality. Gallbladder unremarkable. Pancreas: No focal  abnormality or ductal dilatation. Spleen: No focal abnormality.  Normal size. Adrenals/Urinary Tract: No adrenal abnormality. No focal renal abnormality. No stones or hydronephrosis. Urinary bladder is unremarkable. Stomach/Bowel: Motion artifact degrades image quality and limits evaluation of bowel in the mid abdomen. No  visible bowel obstruction or inflammatory process. Moderate stool throughout the colon. Lymphatic: No adenopathy Reproductive: No visible focal abnormality. Other: No free fluid or free air. Musculoskeletal: No acute bony abnormality. IMPRESSION: VASCULAR Calcified and noncalcified irregular plaque throughout the aorta and iliac vessels bilaterally. 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 Some clot/thrombus extends into the proximal left superficial femoral artery. Below this, the superficial femoral artery and popliteal artery are widely patent. Three-vessel runoff bilaterally with dominant vessel posterior tibial artery. NON-VASCULAR No acute findings in the abdomen or pelvis. Dependent atelectasis in the lower lobes. Moderate stool burden. Electronically Signed   By: Janeece Mechanic M.D.   On: 05/23/2024 18:09    Medications Ordered in ED Medications  aspirin EC tablet 81 mg (81 mg Oral Given 05/23/24 2018)  heparin ADULT infusion 100 units/mL (25000 units/250mL) (1,150 Units/hr Intravenous New Bag/Given 05/23/24 2125)  sodium chloride  flush (NS) 0.9 % injection 3 mL (has no administration in time range)  atorvastatin (LIPITOR) tablet 80 mg (has no administration in time range)  pantoprazole (PROTONIX) EC tablet 40 mg (40 mg Oral Given 05/23/24 2126)  sodium chloride  flush (NS) 0.9 % injection 3 mL (3 mLs Intravenous Given 05/23/24 2159)  sodium chloride  flush (NS) 0.9 % injection 3 mL (has no administration in time range)  0.9 %  sodium chloride  infusion (has no administration in time range)  acetaminophen  (TYLENOL ) tablet 650 mg (has no administration in time range)    Or  acetaminophen  (TYLENOL ) suppository 650 mg (has no administration in time range)  ondansetron  (ZOFRAN ) tablet 4 mg (has no administration in time range)    Or  ondansetron  (ZOFRAN ) injection 4 mg (has no administration in time range)  nicotine (NICODERM  CQ - dosed in mg/24 hours) patch 14 mg (14 mg Transdermal Not Given 05/23/24 2127)  ipratropium-albuterol (DUONEB) 0.5-2.5 (3) MG/3ML nebulizer solution 3 mL (has no administration in time range)  hydrOXYzine (ATARAX) tablet 25 mg (25 mg Oral Given 05/23/24 2158)  iohexol  (OMNIPAQUE ) 350 MG/ML injection 100 mL (100 mLs Intravenous Contrast Given 05/23/24 1642)  atorvastatin (LIPITOR) tablet 40 mg (40 mg Oral Given 05/23/24 2018)  iohexol  (OMNIPAQUE ) 350 MG/ML injection 75 mL (75 mLs Intravenous Contrast Given 05/23/24 1949)  heparin bolus via infusion 5,000 Units (5,000 Units Intravenous Bolus from Bag 05/23/24 2126)    ED Course/ Medical Decision Making/ A&P  CRISTINO DEGROFF is a 48 y.o. male presents as detailed above  Differential ddx: Arterial thrombus, peripheral vascular disease, claudication,  On arrival, patient afebrile hemodynamically stable no hypoxia or respiratory distress. Pertinent PE findings: Intermittent thready palpable pulse of left DP.  Intact Doppler signal.  Left foot slightly cool compared to right  ED Work-up: Please see details of labs and imaging listed above. In summary, patient with complete occlusion of left common femoral artery however has reconstitution and distal flow.  Case discussed with vascular surgery who recommends high intensity statin and aspirin.  Initially discussed outpatient treatment however we will proceed with heparinization and vascular surgery to follow-up tomorrow.  Vascular surgery also requested CT angio chest which was grossly unremarkable.  Patient agreeable to inpatient treatment.  Patient received aspirin atorvastatin in  ED.  Heparin bolus and infusion initiated.  Overall impression peripheral vascular disease with claudication  Patient will be admitted for further evaluation and treatment.   Patient seen with supervising physician who agrees with plan.  Final Clinical Impression(s) / ED Diagnoses Final diagnoses:  PVD (peripheral vascular  disease) with claudication (HCC)       Angele Keller, DO 05/23/24 2204    Deatra Face, MD 05/24/24 1600

## 2024-05-23 NOTE — ED Notes (Signed)
 CCMD called and patient placed on monitor

## 2024-05-23 NOTE — Telephone Encounter (Signed)
 Triage Disposition: ED Patient/caregiver understands and will follow disposition?: FYI Only or Action Required?: FYI only for provider  Patient was last seen in primary care on 09/11/2021 by Lorel Roes, NP. Called Nurse Triage reporting Leg Pain. Symptoms began several weeks ago. Interventions attempted: Rest, hydration, or home remedies. Symptoms are: gradually worsening.  Triage Disposition: See HCP Within 4 Hours (Or PCP Triage)  Patient/caregiver understands and will follow disposition?:         Copied from CRM [id number]. Topic: Clinical - Red Word Triage >> May 23, 2024  9:53 AM Eleanore Grey wrote: Red Word that prompted transfer to Nurse Triage: sharp pain in left leg that travels, lower leg is cool to the touch. Has been going on for about 3 weeks. Patient's wife Rice Chamorro is calling on behalf of patient. Reason for Disposition  [1] Thigh or calf pain AND [2] only 1 side AND [3] present > 1 hour (Exception: Chronic unchanged pain.)  Answer Assessment - Initial Assessment Questions 1. ONSET: "When did the pain start?"      3 weeks ago  2. LOCATION: "Where is the pain located?"      Left leg near groin  3. PAIN: "How bad is the pain?"    (Scale 1-10; or mild, moderate, severe)   -  MILD (1-3): doesn't interfere with normal activities    -  MODERATE (4-7): interferes with normal activities (e.g., work or school) or awakens from sleep, limping    -  SEVERE (8-10): excruciating pain, unable to do any normal activities, unable to walk     mod 4. WORK OR EXERCISE: "Has there been any recent work or exercise that involved this part of the body?"      yes 5. CAUSE: "What do you think is causing the leg pain?"     ? Fall within 6. OTHER SYMPTOMS: "Do you have any other symptoms?" (e.g., chest pain, back pain, breathing difficulty, swelling, rash, fever, numbness, weakness)     Lower leg and foot cooler numbness and tingling to 2 mid pulse 7. PREGNANCY: "Is there any chance you are  pregnant?" "When was your last menstrual period?"     N/a  Protocols used: Leg Pain-A-AH

## 2024-05-23 NOTE — H&P (Addendum)
 History and Physical    Zachary Rodriguez WJX:[id number] DOB: 10/07/1976 DOA: 05/23/2024  PCP: Pcp, No   Patient coming from: Home   Chief Complaint:  Chief Complaint  Patient presents with   Leg Pain   ED TRIAGE note:Patient c/o left leg pain after short distances with burning down his leg and numbness in his foot. Wife reports cooler on the left than the right as well. PA and RN unable to palpate pulse in triage, very faint pulse possibly heard with doppler.   HPI:  Zachary Rodriguez is a 48 y.o. male with medical history significant of chronic smoking cigarette, generalized anxiety disorder, hyperlipidemia, history of ventral hernia repair, chronic abdominal pain and nausea presented emergency department complaining of left flank pain.  Patient reported left-sided leg pain started with short distance walking.  He also has left-sided numbness of the foot.  Wife reported that left-sided foot is cooler to touch as compared to right side.  Patient reported pain get worse with exercise/walking and improved with rest.  No previous history of DVT and no personal history of cancer. Patient denies any chest pain, palpitation or shortness of breath. On my evaluation at the bedside patient is not very happy and wants to leave without the treatment.  I have explained patient that he has a complete blockage of the artery of his leg which will place him in danger of development of gangrene and which will leads to amputation of the foot eventually.  After discussing with the patient he is agreeing to stay in the hospital maybe 1 or 2 days and to speak with vascular surgery for further course of care.  Wife at the bedside.   ED Course:  At presentation to ED patient found to be hypertensive blood pressure 161/94 and tachycardic 103 improved to 89.  Blood pressure also has been improved.  Hemodynamically stable. CBC showing leukocytosis 12.7, normal H&H and normal platelet count. CMP showing low bicarb  19. Normal pro time INR.  CT angio chest abdomen pelvis no abnormality of the chest.  Aortic atherosclerosis.  Mixed irregular density of the plaque in the descending aorta.  No penetratingatherosclerotic ulcer or ulcerated plaque. 3. Emphysema (ICD10-J43.9).  CT angio aortofemoral bilateral runoff showed : Calcified and noncalcified irregular plaque throughout the aorta and iliac vessels bilaterally.  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 Some clot/thrombus extends into the proximal left superficial femoral artery. Below this, the superficial femoral artery and popliteal artery are widely patent. Three-vessel runoff bilaterally with dominant vessel posterior tibial artery.   In the ED patient started on heparin drip, aspirin and Lipitor.  ED physician consulted vascular surgery Dr. Rosalva Comber recommended to start treating with heparin drip and will see patient in the morning and plan for possible intervention on Friday 6/13.  Hospitalist has been consulted for further management of Left lower extremity PAD with complete occlusion of the left common femoral artery.  Significant labs in the ED: Lab Orders         CBC with Differential         Comprehensive metabolic panel         Protime-INR         APTT         Heparin level (unfractionated)         Heparin level (unfractionated)         Lipid panel         Hemoglobin  A1c         Comprehensive metabolic panel         CBC         Protime-INR         APTT         HIV Antibody (routine testing w rflx)         I-stat chem 8, ED         I-stat chem 8, ed       Review of Systems:  Review of Systems  Constitutional:  Negative for chills, fever, malaise/fatigue and weight loss.  Respiratory:  Negative for cough, sputum production and shortness of breath.   Cardiovascular:  Positive for claudication. Negative for chest pain, palpitations, orthopnea,  leg swelling and PND.  Gastrointestinal:  Negative for abdominal pain, blood in stool, constipation, diarrhea, heartburn, melena, nausea and vomiting.  Musculoskeletal:  Negative for back pain, joint pain, myalgias and neck pain.  Neurological:  Negative for dizziness and headaches.  Psychiatric/Behavioral:  The patient is not nervous/anxious.     Past Medical History:  Diagnosis Date   Abdominal hernia    Elevated LDL cholesterol level 03/20/2020    Past Surgical History:  Procedure Laterality Date   HERNIA REPAIR     MOUTH SURGERY       reports that he has been smoking cigarettes. He has never used smokeless tobacco. He reports that he does not currently use drugs after having used the following drugs: Marijuana. He reports that he does not drink alcohol.  No Known Allergies  Family History  Problem Relation Age of Onset   COPD Paternal Grandfather    Emphysema Paternal Grandfather     Prior to Admission medications   Medication Sig Start Date End Date Taking? Authorizing Provider  ondansetron  (ZOFRAN ) 4 MG tablet Take 1 tablet (4 mg total) by mouth every 8 (eight) hours as needed for nausea or vomiting. 10/10/21   Carlan, Chelsea L, NP  sucralfate  (CARAFATE ) 1 g tablet Take 1 tablet (1 g total) by mouth 4 (four) times daily. 10/10/21   Patterson Bora, NP     Physical Exam: Vitals:   05/23/24 1800 05/23/24 1815 05/23/24 1828 05/23/24 2020  BP:   (!) 142/91 (!) 157/90  Pulse: 80 82 74 89  Resp: 13 19 18 12   Temp:   98.4 F (36.9 C) 98.2 F (36.8 C)  TempSrc:   Oral Oral  SpO2: 100% 99% 100% 100%  Weight:      Height:        Physical Exam Vitals and nursing note reviewed.  HENT:     Mouth/Throat:     Mouth: Mucous membranes are moist.  Eyes:     Pupils: Pupils are equal, round, and reactive to light.  Cardiovascular:     Rate and Rhythm: Normal rate and regular rhythm.     Pulses: Normal pulses.     Heart sounds: Normal heart sounds.  Pulmonary:      Effort: Pulmonary effort is normal.     Breath sounds: Normal breath sounds.  Abdominal:     General: Bowel sounds are normal.  Musculoskeletal:        General: No swelling, tenderness, deformity or signs of injury.     Cervical back: Neck supple.     Right lower leg: No edema.     Left lower leg: No edema.     Comments: Unable to appreciate peripheral distal pulses of the left lower extremity.  Faint pulse  of the right lower extremity.  Skin:    Capillary Refill: Capillary refill takes less than 2 seconds.  Neurological:     Mental Status: He is oriented to person, place, and time.     Sensory: No sensory deficit.     Motor: No weakness.  Psychiatric:        Mood and Affect: Mood is elated. Affect is angry.        Speech: Speech normal.        Behavior: Behavior is agitated. Behavior is cooperative.        Thought Content: Thought content normal.        Cognition and Memory: Cognition and memory normal.        Judgment: Judgment normal.      Labs on Admission: I have personally reviewed following labs and imaging studies  CBC: Recent Labs  Lab 05/23/24 1437 05/23/24 1502 05/23/24 1631  WBC 12.7*  --   --   NEUTROABS 8.8*  --   --   HGB 17.0 17.3* 17.3*  HCT 49.9 51.0 51.0  MCV 81.9  --   --   PLT 302  --   --    Basic Metabolic Panel: Recent Labs  Lab 05/23/24 1437 05/23/24 1502 05/23/24 1631  NA 137 139 140  K 3.7 3.8 3.8  CL 107 106 107  CO2 19*  --   --   GLUCOSE 130* 134* 109*  BUN 19 22* 20  CREATININE 1.01 1.10 1.10  CALCIUM 10.0  --   --    GFR: Estimated Creatinine Clearance: 73 mL/min (by C-G formula based on SCr of 1.1 mg/dL). Liver Function Tests: Recent Labs  Lab 05/23/24 1437  AST 18  ALT 17  ALKPHOS 74  BILITOT 1.2  PROT 7.3  ALBUMIN 4.5   No results for input(s): "LIPASE", "AMYLASE" in the last 168 hours. No results for input(s): "AMMONIA" in the last 168 hours. Coagulation Profile: Recent Labs  Lab 05/23/24 1625  INR 1.0    Cardiac Enzymes: No results for input(s): "CKTOTAL", "CKMB", "CKMBINDEX", "TROPONINI", "TROPONINIHS" in the last 168 hours. BNP (last 3 results) No results for input(s): "BNP" in the last 8760 hours. HbA1C: No results for input(s): "HGBA1C" in the last 72 hours. CBG: No results for input(s): "GLUCAP" in the last 168 hours. Lipid Profile: No results for input(s): "CHOL", "HDL", "LDLCALC", "TRIG", "CHOLHDL", "LDLDIRECT" in the last 72 hours. Thyroid Function Tests: No results for input(s): "TSH", "T4TOTAL", "FREET4", "T3FREE", "THYROIDAB" in the last 72 hours. Anemia Panel: No results for input(s): "VITAMINB12", "FOLATE", "FERRITIN", "TIBC", "IRON", "RETICCTPCT" in the last 72 hours. Urine analysis:    Component Value Date/Time   COLORURINE YELLOW 04/20/2012 1328   APPEARANCEUR CLEAR 04/20/2012 1328   LABSPEC 1.011 04/20/2012 1328   PHURINE 8.5 (H) 04/20/2012 1328   GLUCOSEU NEGATIVE 04/20/2012 1328   HGBUR NEGATIVE 04/20/2012 1328   BILIRUBINUR NEGATIVE 04/20/2012 1328   KETONESUR TRACE (A) 04/20/2012 1328   PROTEINUR NEGATIVE 04/20/2012 1328   UROBILINOGEN 0.2 04/20/2012 1328   NITRITE NEGATIVE 04/20/2012 1328   LEUKOCYTESUR NEGATIVE 04/20/2012 1328    Radiological Exams on Admission: I have personally reviewed images CT ANGIO CHEST AORTA W/CM & OR WO/CM Result Date: 05/23/2024 CLINICAL DATA:  Arterial embolism suspected, lower extremity, determine source EXAM: CT ANGIOGRAPHY CHEST WITHOUT AND WITH CONTRAST TECHNIQUE: Multidetector CT imaging of the chest was performed using the standard protocol before and during bolus administration of intravenous contrast. Multiplanar CT image reconstructions and MIPs  were obtained to evaluate the vascular anatomy. RADIATION DOSE REDUCTION: This exam was performed according to the departmental dose-optimization program which includes automated exposure control, adjustment of the mA and/or kV according to patient size and/or use of iterative  reconstruction technique. CONTRAST:  75mL OMNIPAQUE  IOHEXOL  350 MG/ML SOLN COMPARISON:  None Available. FINDINGS: Cardiovascular: Normal caliber thoracic aorta without intramural hematoma, penetrating atherosclerotic ulcer, or dissection. Irregular mixed density atherosclerotic plaque in the descending thoracic aorta. No ulcerated plaque. Normal heart size. No pericardial effusion. Negative for pulmonary embolism. Mediastinum/Nodes: Trachea and esophagus are unremarkable. No thoracic adenopathy. Lungs/Pleura: Centrilobular and paraseptal emphysema greatest in the upper lobes. Hypoventilation changes in the lower lobes. No focal consolidation, pleural effusion, or pneumothorax. Upper Abdomen: No acute abnormality. Musculoskeletal: No acute fracture. Review of the MIP images confirms the above findings. IMPRESSION: 1. No acute abnormality in the chest. 2. Aortic Atherosclerosis (ICD10-I70.0). Mild irregular mixed density plaque in the descending aorta. No penetrating atherosclerotic ulcer or ulcerated plaque. 3. Emphysema (ICD10-J43.9). Electronically Signed   By: Rozell Cornet M.D.   On: 05/23/2024 20:05   CT Angio Aortobifemoral W and/or Wo Contrast Result Date: 05/23/2024 CLINICAL DATA:  Claudication EXAM: CT ANGIOGRAPHY OF ABDOMINAL AORTA WITH ILIOFEMORAL RUNOFF TECHNIQUE: Multidetector CT imaging of the abdomen, pelvis and lower extremities was performed using the standard protocol during bolus administration of intravenous contrast. Multiplanar CT image reconstructions and MIPs were obtained to evaluate the vascular anatomy. RADIATION DOSE REDUCTION: This exam was performed according to the departmental dose-optimization program which includes automated exposure control, adjustment of the mA and/or kV according to patient size and/or use of iterative reconstruction technique. CONTRAST:  OMNIPAQUE  IOHEXOL  350 MG/ML SOLN COMPARISON:  None Available. FINDINGS: VASCULAR Aorta: Irregular calcified and  noncalcified plaque throughout the aorta. No aneurysm or dissection. Celiac: Patent without evidence of aneurysm, dissection, vasculitis or significant stenosis. SMA: Patent without evidence of aneurysm, dissection, vasculitis or significant stenosis. Renals: Both renal arteries are patent without evidence of aneurysm, dissection, vasculitis, fibromuscular dysplasia or significant stenosis. IMA: Patent without evidence of aneurysm, dissection, vasculitis or significant stenosis. RIGHT Lower Extremity Inflow: Calcified and noncalcified plaque throughout the common iliac arteries. External iliac artery and internal iliac artery widely patent. Outflow: Calcified plaque in the common femoral artery. No significant stenosis. Superficial and deep femoral arteries widely patent. Popliteal artery widely patent. Runoff: Three-vessel runoff to the ankle with dominant runoff the posterior tibial artery. Dorsalis pedis/anterior tibial artery is diminutive at the level of the ankle. LEFT Lower Extremity Inflow: Calcified and noncalcified plaque throughout the common iliac artery. No significant stenosis. External iliac and internal iliac arteries widely patent. Outflow: Occlusion of the left common femoral artery. Reconstitution at the common femoral artery bifurcation with patent superficial and deep femoral arteries. Linear filling defect noted in the proximal superficial femoral artery, likely extension of thrombus from the occlusive thrombus in the common femoral artery. Mid and distal superficial femoral artery widely patent. Popliteal artery widely patent. Runoff: Three-vessel runoff to the ankle with the dominant vessel being the posterior tibial artery. Veins: No obvious venous abnormality within the limitations of this arterial phase study. Review of the MIP images confirms the above findings. NON-VASCULAR Lower chest: Ground-glass opacities dependently in the lower lobes, likely dependent atelectasis. No effusions.  Hepatobiliary: No focal hepatic abnormality. Gallbladder unremarkable. Pancreas: No focal abnormality or ductal dilatation. Spleen: No focal abnormality.  Normal size. Adrenals/Urinary Tract: No adrenal abnormality. No focal renal abnormality. No stones or hydronephrosis. Urinary bladder is unremarkable.  Stomach/Bowel: Motion artifact degrades image quality and limits evaluation of bowel in the mid abdomen. No visible bowel obstruction or inflammatory process. Moderate stool throughout the colon. Lymphatic: No adenopathy Reproductive: No visible focal abnormality. Other: No free fluid or free air. Musculoskeletal: No acute bony abnormality. IMPRESSION: VASCULAR Calcified and noncalcified irregular plaque throughout the aorta and iliac vessels bilaterally. 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 Some clot/thrombus extends into the proximal left superficial femoral artery. Below this, the superficial femoral artery and popliteal artery are widely patent. Three-vessel runoff bilaterally with dominant vessel posterior tibial artery. NON-VASCULAR No acute findings in the abdomen or pelvis. Dependent atelectasis in the lower lobes. Moderate stool burden. Electronically Signed   By: Janeece Mechanic M.D.   On: 05/23/2024 18:09   EKG: Obtaining EKG  Assessment/Plan: Principal Problem:   Femoral artery occlusion, left (HCC) Active Problems:   PVD (peripheral vascular disease) (HCC)   Generalized anxiety disorder   Depression, recurrent (HCC)   Continuous dependence on cigarette smoking   Hyperlipidemia    Assessment and Plan: Left common femoral artery occlusion-due to clot and thrombosis extending and significant peripheral vascular disease Claudication pain due to peripheral vascular disease Atherosclerosis plaque of the aorta -Patient presenting to emergency department complaining of progressive worsening left lower extremity  pain with exertion with associated numbness and pain improved with rest.  Physical exam revealed left-sided lower extremity cool to touch and unable to appreciate any peripheral pulses of left lower extremity. - CT abdomen angio runoff of bilateral lower extremity showed complete occlusion of the left common femoral artery with reconstitution just above the common femoral artery bifurcation. Some clot/thrombus extends into the proximal left superficial femoral artery. Below this, the superficial femoral artery and popliteal artery are widely patent. Three-vessel runoff bilaterally with dominant vessel posterior tibial artery. -CT abdomen pelvis and chest no acute abnormality of the chest.  Aortic atherosclerosis and Mild irregular mixed density plaque in the descending aorta. No penetrating atherosclerotic ulcer or ulcerated plaque. -In the setting of left common femoral artery occlusion from thrombosis and arthrosclerotic plaque in the ED patient started on heparin drip, aspirin and Lipitor. - Plan to continue heparin drip, aspirin 81 mg daily and Lipitor 80 mg daily.   Holding Plavix in case patient will undergo peripheral artery bypass graft by vascular surgery. -AT physician consulted vascular surgeon Dr. Rosalva Comber tentative plan to take to the OR on Friday 6/13 and will see patient in the morning for further recommendation. - Checking A1L lipid panel. - Counseled and encouraged patient for cessation of smoking.   Generalized anxiety disorder History of depression -Presently patient is not any antianxiety medication at home.  Smoking cigarette -Continue nicotine patch - Counseled and encouraged patient for cessation of smoking.  Patient is not very receptive regarding any counseling and stating that my smoking is not causing the disease of my leg.     Emphysema - CT chest showing emphysematous change in the setting of chronic smoking cigarette.    DVT prophylaxis:  IV heparin  gtts Code Status:  Full Code Diet: Heart healthy diet Family Communication:   Family was present at bedside, at the time of interview. Opportunity was given to ask question and all questions were answered satisfactorily.  Disposition Plan: Continue heparin drip until patient will undergo vascular procedure.  Continue to monitor left lower extremity for development of any ulcer/gangrene. Consults: Vascular surgery Admission status:   Inpatient, Telemetry  bed  Severity of Illness: The appropriate patient status for this patient is INPATIENT. Inpatient status is judged to be reasonable and necessary in order to provide the required intensity of service to ensure the patient's safety. The patient's presenting symptoms, physical exam findings, and initial radiographic and laboratory data in the context of their chronic comorbidities is felt to place them at high risk for further clinical deterioration. Furthermore, it is not anticipated that the patient will be medically stable for discharge from the hospital within 2 midnights of admission.   * I certify that at the point of admission it is my clinical judgment that the patient will require inpatient hospital care spanning beyond 2 midnights from the point of admission due to high intensity of service, high risk for further deterioration and high frequency of surveillance required.Aaron Aas    Cleotha Whalin, MD [facility]  How to contact the [facility] Attending or Consulting provider 7A - 7P or covering provider during after hours 7P -7A, for this patient.  Check the care team in [facility] and look for a) attending/consulting [facility] provider listed and b) the [facility] team listed Log into [email or web address] and use 's universal password to access. If you do not have the password, please contact the hospital operator. Locate the [facility] provider you are looking for under [facility] and page to a number that you can be directly reached. If you still have  difficulty reaching the provider, please page the [facility] (Director on Call) for the Hospitalists listed on amion for assistance.  05/23/2024, 9:15 PM

## 2024-05-24 ENCOUNTER — Telehealth: Payer: Self-pay | Admitting: Vascular Surgery

## 2024-05-24 DIAGNOSIS — I70212 Atherosclerosis of native arteries of extremities with intermittent claudication, left leg: Secondary | ICD-10-CM

## 2024-05-24 DIAGNOSIS — I743 Embolism and thrombosis of arteries of the lower extremities: Secondary | ICD-10-CM

## 2024-05-24 LAB — CBC
HCT: 48.5 % (ref 39.0–52.0)
Hemoglobin: 16.8 g/dL (ref 13.0–17.0)
MCH: 28.3 pg (ref 26.0–34.0)
MCHC: 34.6 g/dL (ref 30.0–36.0)
MCV: 81.8 fL (ref 80.0–100.0)
Platelets: 275 10*3/uL (ref 150–400)
RBC: 5.93 MIL/uL — ABNORMAL HIGH (ref 4.22–5.81)
RDW: 14.6 % (ref 11.5–15.5)
WBC: 9 10*3/uL (ref 4.0–10.5)
nRBC: 0 % (ref 0.0–0.2)

## 2024-05-24 LAB — LIPID PANEL
Cholesterol: 207 mg/dL — ABNORMAL HIGH (ref 0–200)
HDL: 29 mg/dL — ABNORMAL LOW (ref >40)
LDL Cholesterol: 150 mg/dL — ABNORMAL HIGH (ref 0–99)
Total CHOL/HDL Ratio: 7.1 ratio
Triglycerides: 140 mg/dL (ref <150)
VLDL: 28 mg/dL (ref 0–40)

## 2024-05-24 LAB — COMPREHENSIVE METABOLIC PANEL WITH GFR
ALT: 15 U/L (ref 0–44)
AST: 17 U/L (ref 15–41)
Albumin: 4 g/dL (ref 3.5–5.0)
Alkaline Phosphatase: 71 U/L (ref 38–126)
Anion gap: 12 (ref 5–15)
BUN: 16 mg/dL (ref 6–20)
CO2: 19 mmol/L — ABNORMAL LOW (ref 22–32)
Calcium: 9.1 mg/dL (ref 8.9–10.3)
Chloride: 105 mmol/L (ref 98–111)
Creatinine, Ser: 1.04 mg/dL (ref 0.61–1.24)
GFR, Estimated: >60 mL/min (ref >60)
Glucose, Bld: 116 mg/dL — ABNORMAL HIGH (ref 70–99)
Potassium: 3.5 mmol/L (ref 3.5–5.1)
Sodium: 136 mmol/L (ref 135–145)
Total Bilirubin: 1.2 mg/dL (ref 0.0–1.2)
Total Protein: 6.8 g/dL (ref 6.5–8.1)

## 2024-05-24 LAB — PROTIME-INR
INR: 1.1 (ref 0.8–1.2)
Prothrombin Time: 14.5 s (ref 11.4–15.2)

## 2024-05-24 LAB — HEPARIN LEVEL (UNFRACTIONATED)
Heparin Unfractionated: 0.39 [IU]/mL (ref 0.30–0.70)
Heparin Unfractionated: 0.11 [IU]/mL — ABNORMAL LOW (ref 0.30–0.70)
Heparin Unfractionated: 0.56 [IU]/mL (ref 0.30–0.70)

## 2024-05-24 LAB — HIV ANTIBODY (ROUTINE TESTING W REFLEX): HIV Screen 4th Generation wRfx: NONREACTIVE

## 2024-05-24 LAB — APTT: aPTT: 76 s — ABNORMAL HIGH (ref 24–36)

## 2024-05-24 MED ORDER — TRAZODONE HCL 50 MG PO TABS
50.0000 mg | ORAL_TABLET | Freq: Every day | ORAL | Status: DC
  Administered 2024-05-24 – 2024-05-25 (×2): 50 mg via ORAL
  Filled 2024-05-24 (×2): qty 1

## 2024-05-24 MED ORDER — ENSURE PLUS HIGH PROTEIN PO LIQD
237.0000 mL | Freq: Two times a day (BID) | ORAL | Status: DC
  Administered 2024-05-24: 237 mL via ORAL

## 2024-05-24 MED ORDER — HEPARIN BOLUS VIA INFUSION
2500.0000 [IU] | Freq: Once | INTRAVENOUS | Status: AC
  Administered 2024-05-24: 2500 [IU] via INTRAVENOUS
  Filled 2024-05-24: qty 2500

## 2024-05-24 MED ORDER — DOCUSATE SODIUM 100 MG PO CAPS
100.0000 mg | ORAL_CAPSULE | Freq: Two times a day (BID) | ORAL | Status: DC
  Administered 2024-05-24 – 2024-05-26 (×4): 100 mg via ORAL
  Filled 2024-05-24 (×4): qty 1

## 2024-05-24 MED ORDER — BUSPIRONE HCL 5 MG PO TABS
7.5000 mg | ORAL_TABLET | Freq: Two times a day (BID) | ORAL | Status: DC
  Administered 2024-05-24 – 2024-05-26 (×4): 7.5 mg via ORAL
  Filled 2024-05-24 (×4): qty 2

## 2024-05-24 NOTE — Progress Notes (Signed)
 ANTICOAGULATION CONSULT NOTE  Pharmacy Consult for Heparin Indication: limb ischemia  No Known Allergies  Patient Measurements: Height: 5' 9.5" (176.5 cm) Weight: 63.5 kg (140 lb) IBW/kg (Calculated) : 71.85 Heparin Dosing Weight: 63.5 kg  Vital Signs: Temp: 98.4 F (36.9 C) (06/10 0700) Temp Source: Oral (06/10 0700) BP: 131/76 (06/10 0700) Pulse Rate: 65 (06/10 0700)  Labs: Recent Labs    05/23/24 1437 05/23/24 1502 05/23/24 1625 05/23/24 1631 05/24/24 0556 05/24/24 1010  HGB 17.0 17.3*  --  17.3* 16.8  --   HCT 49.9 51.0  --  51.0 48.5  --   PLT 302  --   --   --  275  --   APTT  --   --  35  --  76*  --   LABPROT  --   --  13.7  --  14.5  --   INR  --   --  1.0  --  1.1  --   HEPARINUNFRC  --   --   --   --  0.39 0.11*  CREATININE 1.01 1.10  --  1.10 1.04  --     Estimated Creatinine Clearance (by C-G formula based on SCr of 1.04 mg/dL) Male: 91.4 mL/min Male: 77.2 mL/min  Assessment: 22 yom with a history of anxiety, depression, HLD. Patient is presenting with leg pain x 3 weeks. Heparin per pharmacy consult placed for limb ischemia.  Patient is not on anticoagulation prior to arrival. Hgb 17.3; plt 302  F/u AM level: heparin level is low on 1150 units/hr. According to the patient the IV beeped all night, which likely means it was not running well.  Will give half bolus and go up slightly on the drip. And have requested the nurse to reassess the IV site to determine if it is adequately patent. No signs/symptoms of bleeding reported with CBC stable.  Goal of Therapy:  Heparin level 0.3-0.7 units/ml Monitor platelets by anticoagulation protocol: Yes   Plan:  Give heparin bolus 2500 units x 1 Increase heparin infusion at 1200 units/hr Check confirmatory anti-Xa level in 8 hours and daily while on heparin Continue to monitor H&H and platelets  Lovina Ruddle, PharmD, [facility] Clinical Pharmacist Please see AMION for all Pharmacists' Contact Phone  Numbers 05/24/2024, 12:40 PM

## 2024-05-24 NOTE — Progress Notes (Addendum)
 Progress Note   Patient: Zachary Rodriguez:[id number] DOB: [birth year] DOA: 05/23/2024     1 DOS: the patient was seen and examined on 05/24/2024   Brief hospital course: "Zachary Rodriguez is a 48 y.o. male with medical history significant of chronic smoking cigarette, generalized anxiety disorder, hyperlipidemia, history of ventral hernia repair, chronic abdominal pain and nausea presented emergency department complaining of left leg pain.  Patient reported left-sided leg pain started with short distance walking. " See H&P for full HPI on admission & ED course.  Patient was found to have complete occlusion of the left common femoral artery.  He was admitted to medicine service with vascular surgery consulted.  Plan is for surgical intervention, likely Friday 6/13.    Patient is on heparin with pain improved since admission.    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  Physical exam revealed left-sided lower extremity cool to touch and unable to appreciate any peripheral pulses of left lower extremity. - CT abdomen angio runoff of bilateral lower extremity showed complete occlusion of the left common femoral artery with reconstitution just above the common femoral artery bifurcation. Some clot/thrombus extends into the proximal left superficial femoral artery. Below this, the superficial femoral artery and popliteal artery are widely patent. Three-vessel runoff bilaterally with dominant vessel posterior tibial artery. -CT abdomen pelvis and chest no acute abnormality of the chest.  Aortic atherosclerosis and Mild irregular mixed density plaque in the descending aorta. No  penetrating atherosclerotic ulcer or ulcerated plaque." --Continue IV heparin --Continue ASA, Lipitor --Holding Plavix for surgical intervention --Vascular surgery is consulted -- plan for intervention tomorrow AM --NPO after midnight --Echo ordered per vascular to assess for any mobile thrombus - Counseled and encouraged patient for cessation of smoking, he expresses understanding and plans to quit.     Generalized anxiety disorder History of depression -Pt reported taking Buspar  PRN at home, not prescribed recently however --Psychiatry was consulted on admission, agreed with Buspar  7.5 mg BID - started --Outpatient follow up --Minimize blood draws to reduce situational anxiety   Smoking cigarette -Continue nicotine patch - Counseled and encouraged patient for cessation of smoking.  Patient is not very receptive regarding any counseling and stating that my smoking is not causing the disease of my leg.       Emphysema - CT chest showing emphysematous change in the setting of chronic smoking cigarette.      Subjective: Pt seen with wife at bedside this AM.  He states pain has improved since being on heparin here.  He reports a lot of anxiety, really hates needles and is having frequent blood draws for monitoring of heparin.  Denies other acute complaints.  Very active person at baseline, expressing concern about what his recovery from this will be like. Discussed re-visiting that post-operatively with vascular surgery and PT's input.    Physical Exam: Vitals:   05/24/24 0021 05/24/24 0315 05/24/24 0525 05/24/24 0700  BP: (!) 148/83 136/78 113/76 131/76  Pulse: 71 79 71 65  Resp: 13 16  16   Temp: 98 F (36.7 C) 98.1 F (36.7 C) 98.6 F (37 C) 98.4 F (36.9 C)  TempSrc: Oral Oral Oral Oral  SpO2: 99% 98% 99% 100%  Weight:  Height:       General exam: awake, alert, no acute distress HEENT: atraumatic, clear conjunctiva, anicteric sclera, moist mucus membranes,  hearing grossly normal  Respiratory system: CTAB, no wheezes, rales or rhonchi, normal respiratory effort. Cardiovascular system: normal S1/S2 RRR, no JVD, murmurs, rubs, gallops,  no pedal edema.   Gastrointestinal system: soft, NT, ND, no HSM felt, +bowel sounds. Central nervous system: A&O x4. no gross focal neurologic deficits, normal speech Extremities: minimal differential temperature on palpation of both feet (reportedly an improvement), no edema, normal tone Skin: dry, intact, normal temperature, normal color, No rashes, lesions or ulcers Psychiatry: normal mood, congruent affect, judgement and insight appear normal   Data Reviewed:  Notable labs --  Bicarb 19 Glucose 116 Total chol 207 HDL 29 low LDL 150 high   Family Communication: wife at bedside  Disposition: Status is: Inpatient Remains inpatient appropriate because: remain on IV heparin pending vascular surgery's intervention later this week   Planned Discharge Destination: Home    Time spent: 45 minutes  Author: Montey Apa, DO 05/24/2024 1:45 PM  For on call review [email or web address].

## 2024-05-24 NOTE — Progress Notes (Signed)
 ANTICOAGULATION CONSULT NOTE  Pharmacy Consult for Heparin Indication: limb ischemia  No Known Allergies  Patient Measurements: Height: 5' 9.5" (176.5 cm) Weight: 63.5 kg (140 lb) IBW/kg (Calculated) : 71.85 Heparin Dosing Weight: 63.5 kg  Vital Signs: Temp: 98.6 F (37 C) (06/10 0525) Temp Source: Oral (06/10 0525) BP: 113/76 (06/10 0525) Pulse Rate: 71 (06/10 0525)  Labs: Recent Labs    05/23/24 1437 05/23/24 1502 05/23/24 1625 05/23/24 1631 05/24/24 0556  HGB 17.0 17.3*  --  17.3* 16.8  HCT 49.9 51.0  --  51.0 48.5  PLT 302  --   --   --  275  APTT  --   --  35  --   --   LABPROT  --   --  13.7  --   --   INR  --   --  1.0  --   --   HEPARINUNFRC  --   --   --   --  0.39  CREATININE 1.01 1.10  --  1.10  --     Estimated Creatinine Clearance (by C-G formula based on SCr of 1.1 mg/dL) Male: 62 mL/min Male: 73 mL/min  Assessment: 71 yom with a history of anxiety, depression, HLD. Patient is presenting with leg pain x 3 weeks. Heparin per pharmacy consult placed for limb ischemia.  Patient is not on anticoagulation prior to arrival. Hgb 17.3; plt 302  AM: heparin level within goal on 1150 units/hr. No signs/symptoms of bleeding reported with CBC stable.  Goal of Therapy:  Heparin level 0.3-0.7 units/ml Monitor platelets by anticoagulation protocol: Yes   Plan:  Continue heparin infusion at 1150 units/hr Check confirmatory anti-Xa level in 6 hours and daily while on heparin Continue to monitor H&H and platelets  Young Hensen, PharmD, BCPS Clinical Pharmacist 05/24/2024 6:22 AM

## 2024-05-24 NOTE — Progress Notes (Signed)
 ANTICOAGULATION CONSULT NOTE  Pharmacy Consult for Heparin Indication: limb ischemia  No Known Allergies  Patient Measurements: Height: 5' 9.5" (176.5 cm) Weight: 63.5 kg (140 lb) IBW/kg (Calculated) : 71.85 Heparin Dosing Weight: 63.5 kg  Vital Signs: Temp: 98.1 F (36.7 C) (06/10 2041) Temp Source: Oral (06/10 2041) BP: 124/85 (06/10 2041) Pulse Rate: 60 (06/10 2041)  Labs: Recent Labs    05/23/24 1437 05/23/24 1502 05/23/24 1625 05/23/24 1631 05/24/24 0556 05/24/24 1010 05/24/24 2040  HGB 17.0 17.3*  --  17.3* 16.8  --   --   HCT 49.9 51.0  --  51.0 48.5  --   --   PLT 302  --   --   --  275  --   --   APTT  --   --  35  --  76*  --   --   LABPROT  --   --  13.7  --  14.5  --   --   INR  --   --  1.0  --  1.1  --   --   HEPARINUNFRC  --   --   --   --  0.39 0.11* 0.56  CREATININE 1.01 1.10  --  1.10 1.04  --   --     Estimated Creatinine Clearance (by C-G formula based on SCr of 1.04 mg/dL) Male: 62.9 mL/min Male: 77.2 mL/min  Assessment: 14 yom with a history of anxiety, depression, HLD. Patient is presenting with leg pain x 3 weeks. Heparin per pharmacy consult placed for limb ischemia.  Patient is not on anticoagulation prior to arrival. Hgb 17.3; plt 302  F/u AM level: heparin level is low on 1150 units/hr. According to the patient the IV beeped all night, which likely means it was not running well.  Will give half bolus and go up slightly on the drip. And have requested the nurse to reassess the IV site to determine if it is adequately patent. No signs/symptoms of bleeding reported with CBC stable.  6/10 PM - heparin level 0.56 is therapeutic on 1200 units/hr.  No issues per RN.  Goal of Therapy:  Heparin level 0.3-0.7 units/ml Monitor platelets by anticoagulation protocol: Yes   Plan:  Continue heparin infusion at 1200 units/hr Daily heparin level, CBC Continue to monitor H&H and platelets F/u after surgery tomorrow  Cecillia Cogan,  PharmD Clinical Pharmacist 05/24/2024  9:26 PM

## 2024-05-24 NOTE — Progress Notes (Signed)
 Hospital Consult    Reason for Consult: Occluded left common femoral artery Requesting Physician: ED MRN #:  [id number]  History of Present Illness: This is a 48 y.o. adult who presented to [facility] yesterday with a 3-week history of lifestyle-limiting claudication in the left lower extremity. Imaging demonstrated left common femoral artery thrombosis with extension of thrombus into the proximal SFA.  Initial symptoms were acute, and then progressively worsened.  He has had minimal medical care. Has history of heart palpitations as well as excessive sweating at the hands, feet, and axilla. Denies rest pain, denies sensorimotor deficits.  Past Medical History:  Diagnosis Date   Abdominal hernia    Elevated LDL cholesterol level 03/20/2020    Past Surgical History:  Procedure Laterality Date   HERNIA REPAIR     MOUTH SURGERY      No Known Allergies  Prior to Admission medications   Medication Sig Start Date End Date Taking? Authorizing Provider  ibuprofen  (ADVIL ) 200 MG tablet Take 600-800 mg by mouth every 8 (eight) hours as needed for mild pain (pain score 1-3) (or headaches).   Yes [provider]  ondansetron  (ZOFRAN ) 4 MG tablet Take 1 tablet (4 mg total) by mouth every 8 (eight) hours as needed for nausea or vomiting. Patient not taking: Reported on 05/23/2024 10/10/21   Carlan, Chelsea L, NP  sucralfate  (CARAFATE ) 1 g tablet Take 1 tablet (1 g total) by mouth 4 (four) times daily. Patient not taking: Reported on 05/23/2024 10/10/21   Patterson Bora, NP    Social History   Socioeconomic History   Marital status: Married    Spouse name: Not on file   Number of children: Not on file   Years of education: Not on file   Highest education level: Not on file  Occupational History   Not on file  Tobacco Use   Smoking status: Every Day    Current packs/day: 1.50    Types: Cigarettes   Smokeless tobacco: Never  Vaping Use   Vaping status: Never  Used  Substance and Sexual Activity   Alcohol use: No   Drug use: Not Currently    Types: Marijuana    Comment: 03/19/20- states no drugs    Sexual activity: Not on file  Other Topics Concern   Not on file  Social History Narrative   Not on file   Social Drivers of Health   Financial Resource Strain: Not on file  Food Insecurity: No Food Insecurity (05/24/2024)   Hunger Vital Sign    Worried About Running Out of Food in the Last Year: Never true    Ran Out of Food in the Last Year: Never true  Transportation Needs: No Transportation Needs (05/24/2024)   PRAPARE - Administrator, Civil Service (Medical): No    Lack of Transportation (Non-Medical): No  Physical Activity: Not on file  Stress: Not on file  Social Connections: Not on file  Intimate Partner Violence: Not At Risk (05/24/2024)   Humiliation, Afraid, Rape, and Kick questionnaire    Fear of Current or Ex-Partner: No    Emotionally Abused: No    Physically Abused: No    Sexually Abused: No   Family History  Problem Relation Age of Onset   COPD Paternal Grandfather    Emphysema Paternal Grandfather     ROS: Otherwise negative unless mentioned in HPI  Physical Examination  Vitals:   05/24/24 0525 05/24/24 0700  BP: 113/76 131/76  Pulse:  71 65  Resp:  16  Temp: 98.6 F (37 C) 98.4 F (36.9 C)  SpO2: 99% 100%   Body mass index is 20.38 kg/m.  General:  WDWN in NAD Gait: Not observed HENT: WNL, normocephalic Pulmonary: normal non-labored breathing, without Rales, rhonchi,  wheezing Cardiac: regular Abdomen:  soft, NT/ND, no masses Skin: without rashes Vascular Exam/Pulses: Palpable PT/DP right, nonpalpable left femoral, DP/PT Extremities: without ischemic changes, without Gangrene , without cellulitis; without open wounds;  Musculoskeletal: no muscle wasting or atrophy  Neurologic: A&O X 3;  No focal weakness or paresthesias are detected; speech is fluent/normal Psychiatric:  The pt has Normal  affect. Lymph:  Unremarkable  CBC    Component Value Date/Time   WBC 9.0 05/24/2024 0556   RBC 5.93 (H) 05/24/2024 0556   HGB 16.8 05/24/2024 0556   HGB 16.6 03/19/2020 0953   HCT 48.5 05/24/2024 0556   HCT 49.3 03/19/2020 0953   PLT 275 05/24/2024 0556   PLT 311 03/19/2020 0953   MCV 81.8 05/24/2024 0556   MCV 84 03/19/2020 0953   MCH 28.3 05/24/2024 0556   MCHC 34.6 05/24/2024 0556   RDW 14.6 05/24/2024 0556   RDW 14.3 03/19/2020 0953   LYMPHSABS 2.5 05/23/2024 1437   LYMPHSABS 2.2 03/19/2020 0953   MONOABS 1.3 (H) 05/23/2024 1437   EOSABS 0.0 05/23/2024 1437   EOSABS 0.2 03/19/2020 0953   BASOSABS 0.1 05/23/2024 1437   BASOSABS 0.1 03/19/2020 0953    BMET    Component Value Date/Time   NA 136 05/24/2024 0556   NA 139 03/19/2020 0953   K 3.5 05/24/2024 0556   CL 105 05/24/2024 0556   CO2 19 (L) 05/24/2024 0556   GLUCOSE 116 (H) 05/24/2024 0556   BUN 16 05/24/2024 0556   BUN 13 03/19/2020 0953   CREATININE 1.04 05/24/2024 0556   CREATININE 1.02 10/11/2021 0901   CALCIUM 9.1 05/24/2024 0556   GFRNONAA >60 05/24/2024 0556   GFRAA 106 03/19/2020 0953    COAGS: Lab Results  Component Value Date   INR 1.1 05/24/2024   INR 1.0 05/23/2024   INR 1.0 04/12/2008    ASSESSMENT/PLAN: This is a 48 y.o. adult presenting with left lower extremity lifestyle-limiting claudication with thrombosed left common femoral artery with extension of thrombus into the proximal superficial femoral artery.  Patient is currently on heparin.  I had a long conversation with Harmony regarding the above.  Most notably the etiology of this thrombus being either in situ thrombosis versus cardioembolic.  With history of heart palpitations over the last 3 years, I think he would benefit from Holter monitor.  In the interim, he would benefit from echo to ensure that he does not have any mobile thrombus prior to moving to the operating room for left common femoral artery thrombectomy.  I am working  to find time in the operating room, however likely Friday pending workup findings.   Kayla Part MD MS [facility] [phone] 05/24/2024  9:51 AM

## 2024-05-24 NOTE — Telephone Encounter (Signed)
-----   Message from Zachary Rodriguez sent at 05/23/2024  7:08 PM EDT ----- Please schedule for me either this week or next.   Dx occluded left femoral artery  Doesn't need studies

## 2024-05-24 NOTE — Progress Notes (Signed)
 Pt new admit to room 5N05, accompanied by wife. Alert and oriented, oriented to room and call light system. Pt noted on heparin gtt, IV site unremarkable. Admission reviewed and completed with questions answered to pt's satisfaction. Bed left in lowest position with call light and belongings within reach. Plan of care reviewed.

## 2024-05-24 NOTE — TOC Initial Note (Signed)
 Transition of Care [facility]) - Initial/Assessment Note    Patient Details  Name: Zachary Rodriguez MRN: [id number] Date of Birth: 12/05/1976  [facility]) CM/SW Contact:    Jaime Mayans, RN Phone Number: 05/24/2024, 10:46 AM  Clinical Narrative:                 Patient is a 48 yr old male adm with a left femoral artery clot. Patient is from home with his wife. Plan is for left Fem Artery Thrombectomy, possibly on Friday. TOC will continue to follow.         Patient Goals and CMS Choice            Expected Discharge Plan and Services                                              Prior Living Arrangements/Services                       Activities of Daily Living   ADL Screening (condition at time of admission) Independently performs ADLs?: Yes (appropriate for developmental age) Is the patient deaf or have difficulty hearing?: No Does the patient have difficulty seeing, even when wearing glasses/contacts?: No Does the patient have difficulty concentrating, remembering, or making decisions?: No  Permission Sought/Granted                  Emotional Assessment              Admission diagnosis:  PVD (peripheral vascular disease) with claudication (HCC) [I73.9] Femoral artery occlusion, left (HCC) [I70.202] Patient Active Problem List   Diagnosis Date Noted   Femoral artery occlusion, left (HCC) 05/23/2024   PVD (peripheral vascular disease) (HCC) 05/23/2024   Continuous dependence on cigarette smoking 05/23/2024   Hyperlipidemia 05/23/2024   Abdominal pain, epigastric 10/10/2021   Generalized abdominal pain 09/11/2021   Generalized anxiety disorder 10/09/2020   Depression, recurrent (HCC) 10/09/2020   Elevated LDL cholesterol level 03/20/2020   PCP:  Pcp, No Pharmacy:   [id number] - [place], [place] - [address] [place] [address] Phone:  [phone] Fax: [phone]     Social Drivers of Health (SDOH) Social History: SDOH Screenings   Food Insecurity: No Food Insecurity (05/24/2024)  Housing: Low Risk  (05/24/2024)  Transportation Needs: No Transportation Needs (05/24/2024)  Utilities: Not At Risk (05/24/2024)  Depression (PHQ2-9): Medium Risk (08/27/2021)  Tobacco Use: High Risk (05/23/2024)   SDOH Interventions:     Readmission Risk Interventions     No data to display

## 2024-05-24 NOTE — Progress Notes (Signed)
 Plan will be for Surgery tomorrow morning. Please make  NPO midnight. Can stop heparin drip on call to OR

## 2024-05-24 NOTE — Telephone Encounter (Signed)
 Pt currently is hospital. Appt scheduled for 07/03 and added to waitlist

## 2024-05-25 ENCOUNTER — Encounter (HOSPITAL_COMMUNITY)
Admission: EM | Disposition: A | Discharge status: Home / Self Care | Payer: Self-pay | Source: Home / Self Care | Attending: Internal Medicine

## 2024-05-25 ENCOUNTER — Encounter (HOSPITAL_COMMUNITY): Payer: Self-pay | Admitting: Internal Medicine

## 2024-05-25 ENCOUNTER — Inpatient Hospital Stay (HOSPITAL_COMMUNITY): Admitting: Registered Nurse

## 2024-05-25 ENCOUNTER — Other Ambulatory Visit (HOSPITAL_COMMUNITY)

## 2024-05-25 ENCOUNTER — Other Ambulatory Visit: Payer: Self-pay

## 2024-05-25 DIAGNOSIS — I70202 Unspecified atherosclerosis of native arteries of extremities, left leg: Secondary | ICD-10-CM | POA: Diagnosis not present

## 2024-05-25 DIAGNOSIS — I743 Embolism and thrombosis of arteries of the lower extremities: Secondary | ICD-10-CM | POA: Diagnosis not present

## 2024-05-25 DIAGNOSIS — Z9889 Other specified postprocedural states: Secondary | ICD-10-CM

## 2024-05-25 DIAGNOSIS — E785 Hyperlipidemia, unspecified: Secondary | ICD-10-CM

## 2024-05-25 DIAGNOSIS — F17210 Nicotine dependence, cigarettes, uncomplicated: Secondary | ICD-10-CM | POA: Diagnosis not present

## 2024-05-25 DIAGNOSIS — F418 Other specified anxiety disorders: Secondary | ICD-10-CM | POA: Diagnosis not present

## 2024-05-25 HISTORY — PX: THROMBECTOMY FEMORAL ARTERY: SHX6406

## 2024-05-25 HISTORY — PX: ENDARTERECTOMY FEMORAL: SHX5804

## 2024-05-25 LAB — HEPARIN LEVEL (UNFRACTIONATED): Heparin Unfractionated: >1.1 [IU]/mL — ABNORMAL HIGH (ref 0.30–0.70)

## 2024-05-25 LAB — MRSA NEXT GEN BY PCR, NASAL: MRSA by PCR Next Gen: NOT DETECTED

## 2024-05-25 LAB — SURGICAL PCR SCREEN
MRSA, PCR: NEGATIVE
Staphylococcus aureus: NEGATIVE

## 2024-05-25 LAB — POCT ACTIVATED CLOTTING TIME
Activated Clotting Time: 285 s
Activated Clotting Time: 314 s

## 2024-05-25 LAB — HEMOGLOBIN A1C
Hgb A1c MFr Bld: 5.7 % — ABNORMAL HIGH (ref 4.8–5.6)
Mean Plasma Glucose: 117 mg/dL

## 2024-05-25 LAB — ABO/RH: ABO/RH(D): O POS

## 2024-05-25 LAB — TYPE AND SCREEN
ABO/RH(D): O POS
Antibody Screen: NEGATIVE

## 2024-05-25 SURGERY — THROMBECTOMY, ARTERY, FEMORAL
Anesthesia: General | Site: Leg Upper | Laterality: Left

## 2024-05-25 MED ORDER — HEPARIN (PORCINE) 25000 UT/250ML-% IV SOLN
1200.0000 [IU]/h | INTRAVENOUS | Status: DC
  Administered 2024-05-25: 1200 [IU]/h via INTRAVENOUS
  Filled 2024-05-25: qty 250

## 2024-05-25 MED ORDER — MIDAZOLAM HCL 2 MG/2ML IJ SOLN
INTRAMUSCULAR | Status: DC | PRN
  Administered 2024-05-25: 2 mg via INTRAVENOUS

## 2024-05-25 MED ORDER — MIDAZOLAM HCL 2 MG/2ML IJ SOLN
INTRAMUSCULAR | Status: AC
  Filled 2024-05-25: qty 2

## 2024-05-25 MED ORDER — 0.9 % SODIUM CHLORIDE (POUR BTL) OPTIME
TOPICAL | Status: DC | PRN
  Administered 2024-05-25: 1000 mL

## 2024-05-25 MED ORDER — CHLORHEXIDINE GLUCONATE 0.12 % MT SOLN
15.0000 mL | Freq: Once | OROMUCOSAL | Status: AC
Start: 2024-05-25 — End: 2024-05-25

## 2024-05-25 MED ORDER — HEPARIN 6000 UNIT IRRIGATION SOLUTION
Status: AC
Start: 2024-05-25 — End: 2024-05-25
  Filled 2024-05-25: qty 500

## 2024-05-25 MED ORDER — ORAL CARE MOUTH RINSE: 15.0000 mL | Freq: Once | OROMUCOSAL | Status: AC

## 2024-05-25 MED ORDER — MEPERIDINE HCL 25 MG/ML IJ SOLN: 6.2500 mg | INTRAMUSCULAR | Status: DC | PRN

## 2024-05-25 MED ORDER — PROPOFOL 10 MG/ML IV BOLUS
INTRAVENOUS | Status: AC
  Filled 2024-05-25: qty 20

## 2024-05-25 MED ORDER — HYDROMORPHONE HCL 1 MG/ML IJ SOLN
0.5000 mg | INTRAMUSCULAR | Status: DC | PRN
  Administered 2024-05-25: 0.5 mg via INTRAVENOUS
  Filled 2024-05-25: qty 0.5

## 2024-05-25 MED ORDER — ALBUMIN HUMAN 5 % IV SOLN: INTRAVENOUS | Status: DC | PRN

## 2024-05-25 MED ORDER — SUGAMMADEX SODIUM 200 MG/2ML IV SOLN
INTRAVENOUS | Status: DC | PRN
  Administered 2024-05-25: 200 mg via INTRAVENOUS

## 2024-05-25 MED ORDER — PHENYLEPHRINE HCL-NACL 20-0.9 MG/250ML-% IV SOLN
INTRAVENOUS | Status: DC | PRN
  Administered 2024-05-25: 15 ug/min via INTRAVENOUS

## 2024-05-25 MED ORDER — LIDOCAINE 2% (20 MG/ML) 5 ML SYRINGE
INTRAMUSCULAR | Status: DC | PRN
  Administered 2024-05-25: 20 mg via INTRAVENOUS

## 2024-05-25 MED ORDER — HYDROMORPHONE HCL 1 MG/ML IJ SOLN
INTRAMUSCULAR | Status: AC
  Filled 2024-05-25: qty 0.5

## 2024-05-25 MED ORDER — CEFAZOLIN SODIUM 1 G IJ SOLR
INTRAMUSCULAR | Status: AC
  Filled 2024-05-25: qty 20

## 2024-05-25 MED ORDER — HEPARIN 6000 UNIT IRRIGATION SOLUTION
Status: DC | PRN
  Administered 2024-05-25: 1

## 2024-05-25 MED ORDER — PROTAMINE SULFATE 10 MG/ML IV SOLN
INTRAVENOUS | Status: DC | PRN
Start: 2024-05-25 — End: 2024-05-25
  Administered 2024-05-25: 50 mg via INTRAVENOUS

## 2024-05-25 MED ORDER — FENTANYL CITRATE (PF) 250 MCG/5ML IJ SOLN
INTRAMUSCULAR | Status: AC
  Filled 2024-05-25: qty 5

## 2024-05-25 MED ORDER — HYDROMORPHONE HCL 1 MG/ML IJ SOLN
0.2500 mg | INTRAMUSCULAR | Status: DC | PRN
  Administered 2024-05-25 (×4): 0.5 mg via INTRAVENOUS

## 2024-05-25 MED ORDER — PROPOFOL 10 MG/ML IV BOLUS
INTRAVENOUS | Status: DC | PRN
  Administered 2024-05-25: 50 mg via INTRAVENOUS
  Administered 2024-05-25: 200 mg via INTRAVENOUS

## 2024-05-25 MED ORDER — CHLORHEXIDINE GLUCONATE 0.12 % MT SOLN
OROMUCOSAL | Status: AC
  Administered 2024-05-25: 15 mL via OROMUCOSAL
  Filled 2024-05-25: qty 15

## 2024-05-25 MED ORDER — LACTATED RINGERS IV SOLN
INTRAVENOUS | Status: DC | PRN
Start: 2024-05-25 — End: 2024-05-25

## 2024-05-25 MED ORDER — ONDANSETRON HCL 4 MG/2ML IJ SOLN
INTRAMUSCULAR | Status: DC | PRN
  Administered 2024-05-25: 4 mg via INTRAVENOUS

## 2024-05-25 MED ORDER — PROTAMINE SULFATE 10 MG/ML IV SOLN
INTRAVENOUS | Status: AC
  Filled 2024-05-25: qty 5

## 2024-05-25 MED ORDER — OXYCODONE HCL 5 MG PO TABS: 5.0000 mg | ORAL_TABLET | Freq: Once | ORAL | Status: DC | PRN

## 2024-05-25 MED ORDER — CEFAZOLIN SODIUM-DEXTROSE 2-3 GM-%(50ML) IV SOLR
INTRAVENOUS | Status: DC | PRN
  Administered 2024-05-25: 2 g via INTRAVENOUS

## 2024-05-25 MED ORDER — FENTANYL CITRATE (PF) 250 MCG/5ML IJ SOLN
INTRAMUSCULAR | Status: DC | PRN
  Administered 2024-05-25 (×3): 50 ug via INTRAVENOUS
  Administered 2024-05-25: 100 ug via INTRAVENOUS

## 2024-05-25 MED ORDER — HYDROMORPHONE HCL 1 MG/ML IJ SOLN
INTRAMUSCULAR | Status: AC
  Filled 2024-05-25: qty 1

## 2024-05-25 MED ORDER — HYDROMORPHONE HCL 1 MG/ML IJ SOLN
INTRAMUSCULAR | Status: DC | PRN
  Administered 2024-05-25: .5 mg via INTRAVENOUS

## 2024-05-25 MED ORDER — OXYCODONE HCL 5 MG PO TABS
5.0000 mg | ORAL_TABLET | Freq: Four times a day (QID) | ORAL | Status: DC | PRN
  Administered 2024-05-25 – 2024-05-26 (×4): 5 mg via ORAL
  Filled 2024-05-25 (×4): qty 1

## 2024-05-25 MED ORDER — HEPARIN SODIUM (PORCINE) 1000 UNIT/ML IJ SOLN
INTRAMUSCULAR | Status: DC | PRN
  Administered 2024-05-25: 7000 [IU] via INTRAVENOUS

## 2024-05-25 MED ORDER — OXYCODONE HCL 5 MG/5ML PO SOLN: 5.0000 mg | Freq: Once | ORAL | Status: DC | PRN

## 2024-05-25 MED ORDER — PROPOFOL 10 MG/ML IV BOLUS
INTRAVENOUS | Status: AC
Start: 2024-05-25 — End: 2024-05-25
  Filled 2024-05-25: qty 20

## 2024-05-25 MED ORDER — LACTATED RINGERS IV SOLN: INTRAVENOUS | Status: DC

## 2024-05-25 MED ORDER — HEMOSTATIC AGENTS (NO CHARGE) OPTIME
TOPICAL | Status: DC | PRN
  Administered 2024-05-25: 1 via TOPICAL

## 2024-05-25 MED ORDER — ROCURONIUM BROMIDE 10 MG/ML (PF) SYRINGE
PREFILLED_SYRINGE | INTRAVENOUS | Status: DC | PRN
  Administered 2024-05-25: 20 mg via INTRAVENOUS
  Administered 2024-05-25: 60 mg via INTRAVENOUS

## 2024-05-25 MED ORDER — HEPARIN SODIUM (PORCINE) 1000 UNIT/ML IJ SOLN
INTRAMUSCULAR | Status: AC
  Filled 2024-05-25: qty 20

## 2024-05-25 MED ORDER — MIDAZOLAM HCL 2 MG/2ML IJ SOLN: 0.5000 mg | Freq: Once | INTRAMUSCULAR | Status: DC | PRN

## 2024-05-25 MED ORDER — DEXAMETHASONE SODIUM PHOSPHATE 10 MG/ML IJ SOLN
INTRAMUSCULAR | Status: DC | PRN
  Administered 2024-05-25: 10 mg via INTRAVENOUS

## 2024-05-25 SURGICAL SUPPLY — 45 items
BAG COUNTER SPONGE SURGICOUNT (BAG) ×2 IMPLANT
BANDAGE ESMARK 6X9 LF (GAUZE/BANDAGES/DRESSINGS) IMPLANT
CANISTER SUCTION 3000ML PPV (SUCTIONS) ×2 IMPLANT
CATH EMB 4FR 80 (CATHETERS) IMPLANT
CLIP TI MEDIUM 24 (CLIP) ×2 IMPLANT
CLIP TI WIDE RED SMALL 24 (CLIP) ×2 IMPLANT
COVER PROBE W GEL 5X96 (DRAPES) ×2 IMPLANT
CUFF TOURN SGL QUICK 42 (TOURNIQUET CUFF) IMPLANT
CUFF TRNQT CYL 24X4X16.5-23 (TOURNIQUET CUFF) IMPLANT
CUFF TRNQT CYL 34X4.125X (TOURNIQUET CUFF) IMPLANT
DERMABOND ADVANCED .7 DNX12 (GAUZE/BANDAGES/DRESSINGS) IMPLANT
DRAPE C-ARM 42X72 X-RAY (DRAPES) ×2 IMPLANT
ELECTRODE REM PT RTRN 9FT ADLT (ELECTROSURGICAL) ×2 IMPLANT
EVACUATOR SILICONE 100CC (DRAIN) IMPLANT
GLOVE BIO SURGEON STRL SZ7.5 (GLOVE) ×2 IMPLANT
GLOVE BIOGEL PI IND STRL 8 (GLOVE) ×2 IMPLANT
GOWN STRL REUS W/ TWL LRG LVL3 (GOWN DISPOSABLE) ×4 IMPLANT
GOWN STRL REUS W/ TWL XL LVL3 (GOWN DISPOSABLE) ×4 IMPLANT
HEMOSTAT SNOW SURGICEL 2X4 (HEMOSTASIS) IMPLANT
INSERT FOGARTY SM (MISCELLANEOUS) IMPLANT
KIT BASIN OR (CUSTOM PROCEDURE TRAY) ×2 IMPLANT
KIT TURNOVER KIT B (KITS) ×2 IMPLANT
LOOP VESSEL MINI RED (MISCELLANEOUS) IMPLANT
NS IRRIG 1000ML POUR BTL (IV SOLUTION) ×4 IMPLANT
PACK PERIPHERAL VASCULAR (CUSTOM PROCEDURE TRAY) ×2 IMPLANT
PAD ARMBOARD POSITIONER FOAM (MISCELLANEOUS) ×4 IMPLANT
PATCH VASC XENOSURE 1X6 (Vascular Products) IMPLANT
STAPLER SKIN PROX 35W (STAPLE) IMPLANT
STOPCOCK 4 WAY LG BORE MALE ST (IV SETS) IMPLANT
SUT ETHILON 3 0 PS 1 (SUTURE) IMPLANT
SUT GORETEX 5 0 TT13 24 (SUTURE) IMPLANT
SUT MNCRL AB 4-0 PS2 18 (SUTURE) ×6 IMPLANT
SUT PROLENE 5 0 C 1 24 (SUTURE) ×2 IMPLANT
SUT PROLENE 6 0 BV (SUTURE) ×2 IMPLANT
SUT PROLENE 7 0 BV 1 (SUTURE) IMPLANT
SUT SILK 2 0 PERMA HAND 18 BK (SUTURE) IMPLANT
SUT SILK 3-0 18XBRD TIE 12 (SUTURE) IMPLANT
SUT VIC AB 2-0 CT1 TAPERPNT 27 (SUTURE) ×4 IMPLANT
SUT VIC AB 3-0 SH 27X BRD (SUTURE) ×6 IMPLANT
SYR 3ML LL SCALE MARK (SYRINGE) IMPLANT
TOWEL GREEN STERILE (TOWEL DISPOSABLE) ×2 IMPLANT
TRAY FOLEY MTR SLVR 16FR STAT (SET/KITS/TRAYS/PACK) ×2 IMPLANT
TUBING EXTENTION W/L.L. (IV SETS) IMPLANT
UNDERPAD 30X36 HEAVY ABSORB (UNDERPADS AND DIAPERS) ×2 IMPLANT
WATER STERILE IRR 1000ML POUR (IV SOLUTION) ×2 IMPLANT

## 2024-05-25 NOTE — Anesthesia Procedure Notes (Signed)
 Arterial Line Insertion Start/End6/10/2024 9:45 AM, 05/25/2024 9:52 AM Performed by: CRNA  Patient location: Pre-op. Preanesthetic checklist: patient identified, IV checked, site marked, risks and benefits discussed, surgical consent, monitors and equipment checked, pre-op evaluation, timeout performed and anesthesia consent Lidocaine 1% used for infiltration Left, radial was placed Catheter size: 20 G Hand hygiene performed  and maximum sterile barriers used   Attempts: 1 Procedure performed using ultrasound guided technique. Ultrasound Notes:anatomy identified, needle tip was noted to be adjacent to the nerve/plexus identified and no ultrasound evidence of intravascular and/or intraneural injection Following insertion, dressing applied and Biopatch. Post procedure assessment: normal and unchanged  Patient tolerated the procedure well with no immediate complications.

## 2024-05-25 NOTE — Progress Notes (Signed)
 ANTICOAGULATION CONSULT NOTE  Pharmacy Consult for Heparin Indication: limb ischemia  No Known Allergies  Patient Measurements: Height: 5' 9.5 (176.5 cm) Weight: 63.5 kg (139 lb 15.9 oz) IBW/kg (Calculated) : 71.85 Heparin Dosing Weight: 63.5 kg  Vital Signs: Temp: 98 F (36.7 C) (06/11 1429) Temp Source: Axillary (06/11 1429) BP: 128/84 (06/11 1429) Pulse Rate: 95 (06/11 1429)  Labs: Recent Labs    05/23/24 1437 05/23/24 1502 05/23/24 1625 05/23/24 1631 05/24/24 0556 05/24/24 0556 05/24/24 1010 05/24/24 2040 05/25/24 0545  HGB 17.0 17.3*  --  17.3* 16.8  --   --   --   --   HCT 49.9 51.0  --  51.0 48.5  --   --   --   --   PLT 302  --   --   --  275  --   --   --   --   APTT  --   --  35  --  76*  --   --   --   --   LABPROT  --   --  13.7  --  14.5  --   --   --   --   INR  --   --  1.0  --  1.1  --   --   --   --   HEPARINUNFRC  --   --   --   --  0.39   < > 0.11* 0.56 >1.10*  CREATININE 1.01 1.10  --  1.10 1.04  --   --   --   --    < > = values in this interval not displayed.    Estimated Creatinine Clearance (by C-G formula based on SCr of 1.04 mg/dL) Male: 56.4 mL/min Male: 77.2 mL/min  Assessment: Zachary Rodriguez with a history of anxiety, depression, HLD. Patient is presenting with leg pain x 3 weeks. Heparin per pharmacy consult placed for limb ischemia.  Patient is not on anticoagulation prior to arrival. Hgb 17.3; plt 302  Issues with heparin infusion running 6/09 PM. According to the patient the IV beeped all night, which likely means it was not running well. 6/10 PM  heparin level 0.56 therapeutic on 1200 units/hr.  No issues per RN. Repeat heparin level now above goal >1.1. Patient off the floor, unable to assess line or possible lab error.  Per Vascular surgery note, plan is for L LE thrombectomy with Dr. Fulton Job in OR today and to continue heparin.   6/11 PM - s/p successful L fem thrombectomy and endarterectomy with bovine patch.  Heparin to start  @2100  per [facility].  Goal of Therapy:  Heparin level 0.3-0.7 units/ml Monitor platelets by anticoagulation protocol: Yes   Plan:  START heparin IV 1200 units/hr 6 hour heparin level Daily heparin level, CBC Continue to monitor H&H and platelets  Thank you for allowing pharmacy to be a part of this patient's care.  Cecillia Cogan, PharmD Clinical Pharmacist 05/25/2024  3:06 PM

## 2024-05-25 NOTE — Progress Notes (Signed)
 Progress Note   Patient: Zachary Rodriguez GEX:[id number] DOB: 03-31-1976 DOA: 05/23/2024     2 DOS: the patient was seen and examined on 05/25/2024   Brief hospital course: Zachary Rodriguez is a 48 y.o. male with medical history significant of chronic smoking cigarette, generalized anxiety disorder, hyperlipidemia, history of ventral hernia repair, chronic abdominal pain and nausea presented emergency department complaining of left leg pain.  Patient reported left-sided leg pain started with short distance walking.  See H&P for full HPI on admission & ED course.  Patient was found to have complete occlusion of the left common femoral artery.  He was admitted to medicine service with vascular surgery consulted.  Plan is for surgical intervention, likely Friday 6/13.    Patient is on heparin with pain improved since admission.    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  Physical exam revealed left-sided lower extremity cool to touch and unable to appreciate any peripheral pulses of left lower extremity. - CT abdomen angio runoff of bilateral lower extremity showed complete occlusion of the left common femoral artery with reconstitution just above the common femoral artery bifurcation. Some clot/thrombus extends into the proximal left superficial femoral artery. Below this, the superficial femoral artery and popliteal artery are widely patent. Three-vessel runoff bilaterally with dominant vessel posterior tibial artery. -CT abdomen pelvis and chest no acute abnormality of the chest.  Aortic atherosclerosis and Mild irregular mixed density plaque in the descending aorta. No penetrating atherosclerotic  ulcer or ulcerated plaque. Now S/p L CFA thrombectomy and thrombectomy of L profunda and SFA, L common femoral endarterectomy  --Continue ASA, Lipitor --Resume plavix per VVS --Remains on heparin infusion  --Echo ordered per vascular to assess for any mobile thrombus - Counseled and encouraged patient for cessation of smoking, he expresses understanding and plans to quit.     Generalized anxiety disorder History of depression -Pt reported taking Buspar  PRN at home, not prescribed recently however --Psychiatry was consulted on admission, agreed with Buspar  7.5 mg BID - started --Outpatient follow up --Minimize blood draws to reduce situational anxiety   Tobacco use disorder  -Continue nicotine patch - Counseled and encouraged patient for cessation of smoking.  Patient is not very receptive regarding any counseling and stating that my smoking is not causing the disease of my leg.     Prediabetes  Nothing to do while inpt   Emphysema - CT chest showing emphysematous change in the setting of chronic tobacco use Not currently requiring oxygen or bronchodilators   Leukocytosis  In the setting of LE arterial occluision      Subjective: Pt seen at bedside this evening after OR. Spouse and two sons at bedside. He is having some LLE pain but states this is improved with pain medication. He has no dyspnea or cough or chest pain.     Physical Exam: Vitals:   05/25/24 1400 05/25/24 1415 05/25/24 1429 05/25/24 1624  BP: (!) 136/91 133/84 128/84 (!) 152/83  Pulse: 93 95 95 88  Resp: 16 13 19 18   Temp:   98 F (36.7 C) 98.1 F (36.7 C)  TempSrc:   Axillary Oral  SpO2: 97% 97% 98% 98%  Weight:      Height:  Physical Exam  Constitutional: In no distress.  Cardiovascular: Normal rate, regular rhythm. No lower extremity edema  Pulmonary: Non labored breathing on room air, no wheezing or rales.  Abdominal: Soft. Normal bowel sounds. Non distended and non  tender Musculoskeletal: LLE able to move toes and feet. Groin incision c/d/I no purulent drainage or surrounding erythema  Neurological: Alert and oriented to person, place, and time. Non focal  Skin: Skin is warm and dry.    Data Reviewed:     Latest Ref Rng & Units 05/24/2024    5:56 AM 05/23/2024    4:31 PM 05/23/2024    3:02 PM  BMP  Glucose 70 - 99 mg/dL 782  956  213   BUN 6 - 20 mg/dL 16  20  22    Creatinine 0.61 - 1.24 mg/dL 0.86  5.78  4.69   Sodium 135 - 145 mmol/L 136  140  139   Potassium 3.5 - 5.1 mmol/L 3.5  3.8  3.8   Chloride 98 - 111 mmol/L 105  107  106   CO2 22 - 32 mmol/L 19     Calcium 8.9 - 10.3 mg/dL 9.1         Latest Ref Rng & Units 05/24/2024    5:56 AM 05/23/2024    4:31 PM 05/23/2024    3:02 PM  CBC  WBC 4.0 - 10.5 K/uL 9.0     Hemoglobin 13.0 - 17.0 g/dL 62.9  52.8  41.3   Hematocrit 39.0 - 52.0 % 48.5  51.0  51.0   Platelets 150 - 400 K/uL 275         Family Communication: wife at bedside  Disposition: Status is: Inpatient Remains inpatient appropriate because: recent vascular surgery   Planned Discharge Destination: Home    Time spent: 35 minutes  Author: Joette Mustard, MD 05/25/2024 6:35 PM  For on call review [email or web address].

## 2024-05-25 NOTE — Plan of Care (Signed)
?  Problem: Education: ?Goal: Knowledge of General Education information will improve ?Description: Including pain rating scale, medication(s)/side effects and non-pharmacologic comfort measures ?Outcome: Progressing ?  ?Problem: Health Behavior/Discharge Planning: ?Goal: Ability to manage health-related needs will improve ?Outcome: Progressing ?  ?Problem: Clinical Measurements: ?Goal: Ability to maintain clinical measurements within normal limits will improve ?Outcome: Progressing ?  ?Problem: Coping: ?Goal: Level of anxiety will decrease ?Outcome: Progressing ?  ?Problem: Safety: ?Goal: Ability to remain free from injury will improve ?Outcome: Progressing ?  ?

## 2024-05-25 NOTE — Anesthesia Postprocedure Evaluation (Signed)
 Anesthesia Post Note  Patient: Zachary Rodriguez  Procedure(s) Performed: LEFT LOWER EXTRIMITY THROMBECTOMY, ARTERY, FEMORAL (Left: Leg Upper) LEFT COMMON FEMORAL ENDARTERECTOMY WITH BOVINE PATCH (Left: Groin)     Patient location during evaluation: PACU Anesthesia Type: General Level of consciousness: awake and alert, patient cooperative and oriented Pain management: pain level controlled Vital Signs Assessment: post-procedure vital signs reviewed and stable Respiratory status: spontaneous breathing, nonlabored ventilation, respiratory function stable and patient connected to nasal cannula oxygen Cardiovascular status: blood pressure returned to baseline and stable Postop Assessment: no apparent nausea or vomiting Anesthetic complications: no   There were no known notable events for this encounter.  Last Vitals:  Vitals:   05/25/24 1415 05/25/24 1429  BP: 133/84 128/84  Pulse: 95 95  Resp: 13 19  Temp:  36.7 C  SpO2: 97% 98%    Last Pain:  Vitals:   05/25/24 1429  TempSrc: Axillary  PainSc: 7                  Kristofer Schaffert,E. Jessicia Napolitano

## 2024-05-25 NOTE — Progress Notes (Signed)
 ANTICOAGULATION CONSULT NOTE  Pharmacy Consult for Heparin Indication: limb ischemia  No Known Allergies  Patient Measurements: Height: 5' 9.5 (176.5 cm) Weight: 63.5 kg (140 lb) IBW/kg (Calculated) : 71.85 Heparin Dosing Weight: 63.5 kg  Vital Signs: Temp: 97.6 F (36.4 C) (06/11 0741) Temp Source: Oral (06/11 0537) BP: 147/92 (06/11 0741) Pulse Rate: 68 (06/11 0741)  Labs: Recent Labs    05/23/24 1437 05/23/24 1502 05/23/24 1625 05/23/24 1631 05/24/24 0556 05/24/24 0556 05/24/24 1010 05/24/24 2040 05/25/24 0545  HGB 17.0 17.3*  --  17.3* 16.8  --   --   --   --   HCT 49.9 51.0  --  51.0 48.5  --   --   --   --   PLT 302  --   --   --  275  --   --   --   --   APTT  --   --  35  --  76*  --   --   --   --   LABPROT  --   --  13.7  --  14.5  --   --   --   --   INR  --   --  1.0  --  1.1  --   --   --   --   HEPARINUNFRC  --   --   --   --  0.39   < > 0.11* 0.56 >1.10*  CREATININE 1.01 1.10  --  1.10 1.04  --   --   --   --    < > = values in this interval not displayed.    Estimated Creatinine Clearance (by C-G formula based on SCr of 1.04 mg/dL) Male: 16.1 mL/min Male: 77.2 mL/min  Assessment: 52 yom with a history of anxiety, depression, HLD. Patient is presenting with leg pain x 3 weeks. Heparin per pharmacy consult placed for limb ischemia.  Patient is not on anticoagulation prior to arrival. Hgb 17.3; plt 302  Issues with heparin infusion running 6/09 PM. According to the patient the IV beeped all night, which likely means it was not running well. 6/10 PM  heparin level 0.56 therapeutic on 1200 units/hr.  No issues per RN. Repeat heparin level now above goal >1.1. Patient off the floor, unable to assess line or possible lab error.  Per Vascular surgery note, plan is for L LE thrombectomy with Dr. Fulton Job in OR today and to continue heparin.   Goal of Therapy:  Heparin level 0.3-0.7 units/ml Monitor platelets by anticoagulation protocol: Yes   Plan:   F/u surgyer plans for holding heparin infusion before procedure Recheck heparin level after OR and daily while on heparin Continue to monitor H&H and platelets  Thank you for allowing pharmacy to be a part of this patient's care.  Claudia Cuff, PharmD, BCPS Clinical Pharmacist

## 2024-05-25 NOTE — Op Note (Signed)
 Date: May 25, 2024  Preoperative diagnosis: Left common femoral artery occlusion with disabling left leg claudication  Postoperative diagnosis: Same  Procedure: 1.  Left common femoral artery thrombectomy including thrombectomy of the profunda and SFA and left lower extremity 2.  Left common femoral endarterectomy with bovine pericardial patch angioplasty  Surgeon: Dr. Young Hensen, MD  Assistant: Dr. Mliss Anderson, MD  Indications: 48 year old male seen with 3 weeks of disabling left leg claudication and found to have left common femoral artery occlusion on CT.  He presents for left common femoral thrombectomy versus endarterectomy after risks benefits discussed.  An assistant was needed given the complexity of the case and also for the patch angioplasty.  Findings: Longitudinal arteriotomy of the left common femoral artery after this was dissected out.  There was acute thrombus throughout the left common femoral artery into the proximal SFA and profunda.  There appeared to be a ruptured posterior plaque that was then endarterectomized after the thrombus was removed.  A bovine patch was sewn on the left common femoral artery.  Palpable femoral pulse and palpable DP pulse at completion.  Excellent backbleeding from the SFA and profunda.  Did pass a #4 Fogarty all the way down the SFA into the foot.  Anesthesia: General  Details: Patient was taken to the operating room after informed consent was obtained.  Placed on operative table in supine position.  General endotracheal anesthesia was induced.  The left groin was then prepped and draped in standard sterile fashion.  Antibiotics were given and timeout performed.  I made a transverse incision in the left groin and dissected down Bovie cautery and used cerebellar retractors.  Femoral sheath was opened longitudinally.  I then got control of the external iliac artery and placed a vessel loop.  Dissected down and got control of the SFA and  profunda and Vesseloops were placed around both vessels.  The patient was then given 100 units/kg IV heparin.  ACT was checked to maintain greater than 250.  I then used Vesseloops on the SFA profunda with a Henley clamp on the distal external iliac artery.  The left common femoral artery was opened longitudinally with 11 blade scalpel and Potts scissors.  There was acute thrombus in the common femoral artery and this was then removed with [facility] under direct visualization.  I then took a Psychologist, prison and probation services and initially passed this down the SFA and got acute arterial thrombus in the proximal SFA.  This was passed all the way into the foot for an additional pass and I got no additional thrombus.  We had good backbleeding.  This was recontrolled with vessel loop again..  I then passed a #4 Fogarty down the profunda and got additional thrombus with excellent backbleeding.  This passed without resistance.  We had excellent pulsatile inflow and Henley clamp was released.  I then used a Runner, broadcasting/film/video and the large posterior plaque that appeared chronic on the back wall of the artery was removed with endarterectomy and I feathered the plaque before the profunda bifurcation.  The distal plaque was tacked down with multiple 6-0 Prolene's.  Dr. Edgardo Goodwill and I then brought a bovine pericardial patch on the field and this was sewn in place with 6-0 Prolene parachute technique and de-aired prior to completion.  We did have to put several repair sutures in it.  Excellent palpable femoral pulse.  There was a brisk Doppler signal in the foot.  Protamine was given for reversal.  I used  Surgicel snow to get hemostasis around the patch.  The groin was then closed in multiple layers of 2-0 Vicryl, 3-0 Vicryl, 4-0 Monocryl and Dermabond.  Taken to recovery in stable condition.    Complication: None  Condition: Stable  Young Hensen, MD [facility] [place] Office: [phone]   Young Hensen

## 2024-05-25 NOTE — Progress Notes (Addendum)
  Progress Note    05/25/2024 7:40 AM * No surgery found *  Subjective:  L leg feeling much better when ambulating in the halls   Vitals:   05/24/24 2041 05/25/24 0537  BP: 124/85 (!) 156/83  Pulse: 60 63  Resp: 18 20  Temp: 98.1 F (36.7 C) 97.8 F (36.6 C)  SpO2: 100% 100%   Physical Exam: Lungs:  non labored Extremities:  feet symmetrically warm; no palpable pedal pulses L foot; soft L femoral pulse Neurologic: A&O  CBC    Component Value Date/Time   WBC 9.0 05/24/2024 0556   RBC 5.93 (H) 05/24/2024 0556   HGB 16.8 05/24/2024 0556   HGB 16.6 03/19/2020 0953   HCT 48.5 05/24/2024 0556   HCT 49.3 03/19/2020 0953   PLT 275 05/24/2024 0556   PLT 311 03/19/2020 0953   MCV 81.8 05/24/2024 0556   MCV 84 03/19/2020 0953   MCH 28.3 05/24/2024 0556   MCHC 34.6 05/24/2024 0556   RDW 14.6 05/24/2024 0556   RDW 14.3 03/19/2020 0953   LYMPHSABS 2.5 05/23/2024 1437   LYMPHSABS 2.2 03/19/2020 0953   MONOABS 1.3 (H) 05/23/2024 1437   EOSABS 0.0 05/23/2024 1437   EOSABS 0.2 03/19/2020 0953   BASOSABS 0.1 05/23/2024 1437   BASOSABS 0.1 03/19/2020 0953    BMET    Component Value Date/Time   NA 136 05/24/2024 0556   NA 139 03/19/2020 0953   K 3.5 05/24/2024 0556   CL 105 05/24/2024 0556   CO2 19 (L) 05/24/2024 0556   GLUCOSE 116 (H) 05/24/2024 0556   BUN 16 05/24/2024 0556   BUN 13 03/19/2020 0953   CREATININE 1.04 05/24/2024 0556   CREATININE 1.02 10/11/2021 0901   CALCIUM 9.1 05/24/2024 0556   GFRNONAA >60 05/24/2024 0556   GFRAA 106 03/19/2020 0953    INR    Component Value Date/Time   INR 1.1 05/24/2024 0556    No intake or output data in the 24 hours ending 05/25/24 0740   Assessment/Plan:  48 y.o. adult presenting with lifestyle limiting claudication due to L CFA thrombus  L leg subjectively feeling much better and is now able to ambulate without pain Unable to palpate pedal pulses L foot; faint femoral pulse Continue IV heparin on call to  OR Continue npo Plan is for L LE thrombectomy with Dr. Fulton Job in OR today   Cordie Deters, PA-C [facility] [phone] 05/25/2024 7:40 AM   VASCULAR STAFF ADDENDUM: I have independently interviewed and examined the patient. I agree with the above.  On exam this morning, he feels like he is doing somewhat better.  I think that the acute portion of the clot extending into the superficial femoral artery is likely resolved.  On physical exam, he continues to have a palpable pulse in the groin.  We have a long discussion regarding the need for left common femoral thrombectomy, to prevent further clot propagation and alleviate lifestyle-limiting claudication.  This will be  performed by my partner Dr. Fulton Job.  I have discussed the risk benefits, [place] to proceed.  Kayla Part MD [facility] Phone Number: [phone] 05/25/2024 9:14 AM

## 2024-05-25 NOTE — Transfer of Care (Signed)
 Immediate Anesthesia Transfer of Care Note  Patient: KENDYL BISSONNETTE  Procedure(s) Performed: LEFT LOWER EXTRIMITY THROMBECTOMY, ARTERY, FEMORAL (Left: Leg Upper) LEFT COMMON FEMORAL ENDARTERECTOMY WITH BOVINE PATCH (Left: Groin)  Patient Location: PACU  Anesthesia Type:General  Level of Consciousness: awake, alert , and oriented  Airway & Oxygen Therapy: Patient Spontanous Breathing  Post-op Assessment: Report given to RN, Post -op Vital signs reviewed and stable, and Patient moving all extremities X 4  Post vital signs: Reviewed and stable  Last Vitals:  Vitals Value Taken Time  BP 123/85 05/25/24 1301  Temp    Pulse 97 05/25/24 1307  Resp 13 05/25/24 1307  SpO2 96 % 05/25/24 1307  Vitals shown include unfiled device data.  Last Pain:  Vitals:   05/25/24 0924  TempSrc:   PainSc: 0-No pain         Complications: There were no known notable events for this encounter.

## 2024-05-25 NOTE — Progress Notes (Signed)
 Pt arrived to unit from PACU, VSS, A/O x 4,  CCMD called ,CHG given, pt oriented to unit,Will continue to monitor.   Eldonna Greenspan Adena Sima, RN    05/25/24 1429  Vitals  Temp 98 F (36.7 C)  Temp Source Axillary  BP 128/84  MAP (mmHg) 99  BP Location Right Arm  BP Method Automatic  Patient Position (if appropriate) Lying  Pulse Rate 95  Pulse Rate Source Monitor  ECG Heart Rate 95  Resp 19  Level of Consciousness  Level of Consciousness Alert  Oxygen Therapy  SpO2 98 %  O2 Device Room Air  O2 Flow Rate (L/min) 0 L/min  Pain Assessment  Pain Scale 0-10  Pain Score 7  Pain Type Acute pain  Pain Location Groin  Pain Orientation Left  MEWS Score  MEWS Temp 0  MEWS Systolic 0  MEWS Pulse 0  MEWS RR 0  MEWS LOC 0  MEWS Score 0  MEWS Score Color Marrie Sizer

## 2024-05-25 NOTE — Progress Notes (Signed)
 Please see Dr. Edger Goody note for full consult.  Discussed plan for left common femoral exploration with likely thrombectomy and possible endarterectomy of underlying chronic component given left common femoral artery occlusion.  He has had about 3 weeks of left leg symptoms.  Risks benefits discussed and also discussed possible patch angioplasty.  He wishes to proceed.  Young Hensen, MD [facility] [place] Office: [phone]   Young Hensen

## 2024-05-25 NOTE — Anesthesia Preprocedure Evaluation (Addendum)
 Anesthesia Evaluation  Patient identified by MRN, date of birth, ID band Patient awake    Reviewed: Allergy & Precautions, NPO status , Patient's Chart, lab work & pertinent test results  History of Anesthesia Complications Negative for: history of anesthetic complications  Airway Mallampati: I  TM Distance: >3 FB Neck ROM: Full    Dental  (+) Edentulous Upper, Edentulous Lower   Pulmonary Current Smoker and Patient abstained from smoking.   breath sounds clear to auscultation       Cardiovascular (-) angina + Peripheral Vascular Disease   Rhythm:Regular Rate:Normal     Neuro/Psych   Anxiety Depression    negative neurological ROS     GI/Hepatic negative GI ROS, Neg liver ROS,,,  Endo/Other  negative endocrine ROS    Renal/GU negative Renal ROS     Musculoskeletal   Abdominal   Peds  Hematology Hb 16.8, plt 275k Heparin infusion   Anesthesia Other Findings   Reproductive/Obstetrics                             Anesthesia Physical Anesthesia Plan  ASA: 3  Anesthesia Plan: General   Post-op Pain Management: Tylenol  PO (pre-op)*   Induction: Intravenous  PONV Risk Score and Plan: 1 and Ondansetron   Airway Management Planned: Oral ETT  Additional Equipment: Arterial line  Intra-op Plan:   Post-operative Plan: Extubation in OR  Informed Consent: I have reviewed the patients History and Physical, chart, labs and discussed the procedure including the risks, benefits and alternatives for the proposed anesthesia with the patient or authorized representative who has indicated his/her understanding and acceptance.       Plan Discussed with: CRNA and Surgeon  Anesthesia Plan Comments:         Anesthesia Quick Evaluation

## 2024-05-25 NOTE — Anesthesia Procedure Notes (Signed)
 Procedure Name: Intubation Date/Time: 05/25/2024 10:54 AM  Performed by: Jamas Maywood, CRNAPre-anesthesia Checklist: Patient identified, Emergency Drugs available, Suction available and Patient being monitored Patient Re-evaluated:Patient Re-evaluated prior to induction Oxygen Delivery Method: Circle system utilized Preoxygenation: Pre-oxygenation with 100% oxygen Induction Type: IV induction Ventilation: Mask ventilation without difficulty and Oral airway inserted - appropriate to patient size Laryngoscope Size: Mac and 4 Grade View: Grade I Tube type: Oral Tube size: 7.5 mm Number of attempts: 1 Airway Equipment and Method: Stylet and Oral airway Placement Confirmation: ETT inserted through vocal cords under direct vision, positive ETCO2 and breath sounds checked- equal and bilateral Secured at: 23 cm Tube secured with: Tape Dental Injury: Teeth and Oropharynx as per pre-operative assessment

## 2024-05-26 ENCOUNTER — Other Ambulatory Visit (HOSPITAL_COMMUNITY): Payer: Self-pay

## 2024-05-26 ENCOUNTER — Inpatient Hospital Stay (HOSPITAL_COMMUNITY)

## 2024-05-26 ENCOUNTER — Encounter (HOSPITAL_COMMUNITY): Payer: Self-pay | Admitting: Vascular Surgery

## 2024-05-26 ENCOUNTER — Telehealth (HOSPITAL_COMMUNITY): Payer: Self-pay | Admitting: Pharmacy Technician

## 2024-05-26 DIAGNOSIS — R008 Other abnormalities of heart beat: Secondary | ICD-10-CM

## 2024-05-26 DIAGNOSIS — I70202 Unspecified atherosclerosis of native arteries of extremities, left leg: Secondary | ICD-10-CM | POA: Diagnosis not present

## 2024-05-26 LAB — BASIC METABOLIC PANEL WITH GFR
Anion gap: 10 (ref 5–15)
BUN: 13 mg/dL (ref 6–20)
CO2: 21 mmol/L — ABNORMAL LOW (ref 22–32)
Calcium: 9.1 mg/dL (ref 8.9–10.3)
Chloride: 104 mmol/L (ref 98–111)
Creatinine, Ser: 1 mg/dL (ref 0.61–1.24)
GFR, Estimated: >60 mL/min (ref >60)
Glucose, Bld: 120 mg/dL — ABNORMAL HIGH (ref 70–99)
Potassium: 3.6 mmol/L (ref 3.5–5.1)
Sodium: 135 mmol/L (ref 135–145)

## 2024-05-26 LAB — CBC
HCT: 40.6 % (ref 39.0–52.0)
Hemoglobin: 13.9 g/dL (ref 13.0–17.0)
MCH: 28.1 pg (ref 26.0–34.0)
MCHC: 34.2 g/dL (ref 30.0–36.0)
MCV: 82.2 fL (ref 80.0–100.0)
Platelets: 229 10*3/uL (ref 150–400)
RBC: 4.94 MIL/uL (ref 4.22–5.81)
RDW: 14.6 % (ref 11.5–15.5)
WBC: 12 10*3/uL — ABNORMAL HIGH (ref 4.0–10.5)
nRBC: 0 % (ref 0.0–0.2)

## 2024-05-26 LAB — ECHOCARDIOGRAM COMPLETE
AR max vel: 3.1 cm2
AV Area VTI: 2.79 cm2
AV Area mean vel: 3 cm2
AV Mean grad: 3 mmHg
AV Peak grad: 5.5 mmHg
Ao pk vel: 1.17 m/s
Area-P 1/2: 5.13 cm2
Calc EF: 62.2 %
Height: 69.5 in
S' Lateral: 3.6 cm
Single Plane A2C EF: 64.3 %
Single Plane A4C EF: 60.3 %
Weight: 2239.87 [oz_av]

## 2024-05-26 LAB — HEPARIN LEVEL (UNFRACTIONATED)
Heparin Unfractionated: 0.59 [IU]/mL (ref 0.30–0.70)
Heparin Unfractionated: 0.57 [IU]/mL (ref 0.30–0.70)

## 2024-05-26 MED ORDER — OXYCODONE HCL 5 MG PO TABS
5.0000 mg | ORAL_TABLET | Freq: Four times a day (QID) | ORAL | 0 refills | Status: AC | PRN
  Filled 2024-05-26: qty 20, 5d supply, fill #0

## 2024-05-26 MED ORDER — APIXABAN 5 MG PO TABS
5.0000 mg | ORAL_TABLET | Freq: Two times a day (BID) | ORAL | Status: DC
  Administered 2024-05-26: 5 mg via ORAL
  Filled 2024-05-26: qty 1

## 2024-05-26 MED ORDER — ASPIRIN 81 MG PO TBEC
81.0000 mg | DELAYED_RELEASE_TABLET | Freq: Every day | ORAL | 1 refills | Status: AC
  Filled 2024-05-26: qty 90, 90d supply, fill #0

## 2024-05-26 MED ORDER — APIXABAN 5 MG PO TABS
5.0000 mg | ORAL_TABLET | Freq: Two times a day (BID) | ORAL | 0 refills | Status: DC
  Filled 2024-05-26: qty 180, 90d supply, fill #0

## 2024-05-26 MED ORDER — PERFLUTREN LIPID MICROSPHERE
1.0000 mL | INTRAVENOUS | Status: AC | PRN
  Administered 2024-05-26: 5 mL via INTRAVENOUS

## 2024-05-26 MED ORDER — POLYETHYLENE GLYCOL 3350 17 G PO PACK: 17.0000 g | PACK | Freq: Every day | ORAL | Status: DC

## 2024-05-26 MED ORDER — BUSPIRONE HCL 7.5 MG PO TABS
7.5000 mg | ORAL_TABLET | Freq: Two times a day (BID) | ORAL | 0 refills | Status: DC
  Filled 2024-05-26: qty 60, 30d supply, fill #0

## 2024-05-26 MED ORDER — SENNA 8.6 MG PO TABS
1.0000 | ORAL_TABLET | Freq: Every day | ORAL | 0 refills | Status: DC
  Filled 2024-05-26: qty 30, 30d supply, fill #0

## 2024-05-26 MED ORDER — ATORVASTATIN CALCIUM 80 MG PO TABS
80.0000 mg | ORAL_TABLET | Freq: Every day | ORAL | 0 refills | Status: DC
  Filled 2024-05-26: qty 90, 90d supply, fill #0

## 2024-05-26 NOTE — Evaluation (Signed)
 Occupational Therapy Evaluation/Discharge Patient Details Name: Zachary Rodriguez MRN: [id number] DOB: 1976/08/13 Today's Date: 05/26/2024   History of Present Illness   48 y.o. male presented 05/23/24 to emergency department complaining of left flank and foot pain. 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 Pt presents now with minor pain in L groin at incision site and R forearm IV. Pt able to manage ADLs, hallway mobility and stairs Independently. Discussed ADL modifications, fall prevention w/ pt/spouse (who is a Engineer, civil (consulting)). Both deny any concerns and hopeful for discharge home today. No further skilled therapy services needed at this time.      If plan is discharge home, recommend the following:   Other (comment) (PRN)     Functional Status Assessment   Patient has not had a recent decline in their functional status     Equipment Recommendations   None recommended by OT     Recommendations for Other Services         Precautions/Restrictions   Precautions Precautions: None Restrictions Weight Bearing Restrictions Per Provider Order: No Other Position/Activity Restrictions: `     Mobility Bed Mobility Overal bed mobility: Modified Independent                  Transfers Overall transfer level: Independent Equipment used: None                      Balance Overall balance assessment: No apparent balance deficits (not formally assessed)                                         ADL either performed or assessed with clinical judgement   ADL Overall ADL's : Independent                                        General ADL Comments: able to don tennis shoes with increased time EOB, mobilize in hallway and manage 4 steps with railing w/o issues. Discussed easier clothing to manage, fall prevention and reducing amount of heavy work that pt does to allow healing.     Vision Ability to See in Adequate Light: 0 Adequate Patient Visual Report: No change from baseline Vision Assessment?: No apparent visual deficits     Perception         Praxis         Pertinent Vitals/Pain Pain Assessment Pain Assessment: Faces Faces Pain Scale: Hurts a little bit Pain Location: R IV, L groin Pain Descriptors / Indicators: Grimacing Pain Intervention(s): Monitored during session     Extremity/Trunk Assessment Upper Extremity Assessment Upper Extremity Assessment: Overall WFL for tasks assessed;Right hand dominant   Lower Extremity Assessment Lower Extremity Assessment: Overall WFL for tasks assessed   Cervical / Trunk Assessment Cervical / Trunk Assessment: Normal   Communication Communication Communication: No apparent difficulties   Cognition Arousal: Alert Behavior During Therapy: WFL for tasks assessed/performed Cognition: No apparent impairments  Following commands: Intact       Cueing  General Comments   Cueing Techniques: Verbal cues  Wife at bedside, is a nurse and plans to take off for a few days to assist as needed   Exercises     Shoulder Instructions      Home Living Family/patient expects to be discharged to:: Private residence Living Arrangements: Spouse/significant other Available Help at Discharge: Family Type of Home: House Home Access: Stairs to enter Secretary/administrator of Steps: 4 Entrance Stairs-Rails: Right;Left Home Layout: One level     Bathroom Shower/Tub: Producer, television/film/video: Standard                Prior Functioning/Environment Prior Level of Function : Independent/Modified  Independent;Working/employed;Driving                    OT Problem List:     OT Treatment/Interventions:        OT Goals(Current goals can be found in the care plan section)   Acute Rehab OT Goals Patient Stated Goal: go home today OT Goal Formulation: All assessment and education complete, DC therapy   OT Frequency:       Co-evaluation              AM-PAC OT 6 Clicks Daily Activity     Outcome Measure Help from another person eating meals?: None Help from another person taking care of personal grooming?: None Help from another person toileting, which includes using toliet, bedpan, or urinal?: None Help from another person bathing (including washing, rinsing, drying)?: None Help from another person to put on and taking off regular upper body clothing?: None Help from another person to put on and taking off regular lower body clothing?: None 6 Click Score: 24   End of Session Equipment Utilized During Treatment: Gait belt Nurse Communication: Mobility status  Activity Tolerance: Patient tolerated treatment well Patient left: in bed;with call bell/phone within reach;with family/visitor present  OT Visit Diagnosis: Pain Pain - Right/Left: Left Pain - part of body:  (groin)                Time: 7829-5621 OT Time Calculation (min): 18 min Charges:  OT General Charges $OT Visit: 1 Visit OT Evaluation $OT Eval Low Complexity: 1 Low  Lawrence Pretty, OTR/L Acute Rehab Services Office: [phone]   Shireen Dory 05/26/2024, 2:07 PM

## 2024-05-26 NOTE — Progress Notes (Signed)
 PT Cancellation Note  Patient Details Name: HAKOP HUMBARGER MRN: [id number] DOB: 04-Feb-1976   Cancelled Treatment:    Reason Eval/Treat Not Completed: PT screened, no needs identified, will sign off  Patient seen by OT and has no PT needs.    Gayle Kava, PT Acute Rehabilitation Services  Office [phone]  Guilford Leep 05/26/2024, 2:13 PM

## 2024-05-26 NOTE — Progress Notes (Addendum)
 ANTICOAGULATION CONSULT NOTE  Pharmacy Consult for Heparin Indication: limb ischemia  No Known Allergies  Patient Measurements: Height: 5' 9.5 (176.5 cm) Weight: 63.5 kg (139 lb 15.9 oz) IBW/kg (Calculated) : 71.85 Heparin Dosing Weight: 63.5 kg  Vital Signs: Temp: 98 F (36.7 C) (06/12 1110) Temp Source: Oral (06/12 1110) BP: 128/67 (06/12 1110) Pulse Rate: 85 (06/12 1110)  Labs: Recent Labs    05/23/24 1437 05/23/24 1502 05/23/24 1625 05/23/24 1631 05/24/24 0556 05/24/24 1010 05/25/24 0545 05/26/24 0341 05/26/24 1254  HGB 17.0   < >  --  17.3* 16.8  --   --  13.9  --   HCT 49.9   < >  --  51.0 48.5  --   --  40.6  --   PLT 302  --   --   --  275  --   --  229  --   APTT  --   --  35  --  76*  --   --   --   --   LABPROT  --   --  13.7  --  14.5  --   --   --   --   INR  --   --  1.0  --  1.1  --   --   --   --   HEPARINUNFRC  --   --   --   --  0.39   < > >1.10* 0.59 0.57  CREATININE 1.01   < >  --  1.10 1.04  --   --  1.00  --    < > = values in this interval not displayed.    Estimated Creatinine Clearance: 80.3 mL/min (by C-G formula based on SCr of 1 mg/dL).  Assessment: 28 yom with a history of anxiety, depression, HLD. Patient is presenting with leg pain x 3 weeks. Heparin per pharmacy consult placed for limb ischemia.  Patient is not on anticoagulation prior to arrival.   s/p successful L fem thrombectomy and endarterectomy with bovine patch on 6/11. Heparin level this afternoon came back therapeutic at 0.57, on 1200 units/hr. No s/sx of bleeding or infusion issues.   Goal of Therapy:  Heparin level 0.3-0.7 units/ml Monitor platelets by anticoagulation protocol: Yes   Plan:  Cont heparin 1200 units/hr Daily heparin level, CBC Continue to monitor H&H and platelets  Thank you for allowing pharmacy to participate in this patient's care,  Nieves Bars, PharmD, BCCCP Clinical Pharmacist  Phone: [phone] 05/26/2024 2:11 PM  Please check AMION  for all [facility] Pharmacy phone numbers After 10:00 PM, call Main Pharmacy [phone]  ADDENDUM Plan to change from heparin infusion to apixaban per conversation with hospitalist. Will order apixaban 5 mg BID. Monitor for s/sx of bleeding.   Thank you for allowing pharmacy to participate in this patient's care,  Nieves Bars, PharmD, BCCCP Clinical Pharmacist

## 2024-05-26 NOTE — Progress Notes (Addendum)
 Progress Note    05/26/2024 8:05 AM 1 Day Post-Op  Subjective:  he says his left groin is a little bit sore but otherwise he feels great. He has walked up and down the hallway without pain    Vitals:   05/26/24 0321 05/26/24 0733  BP: 136/72 131/72  Pulse: 70 64  Resp: 14 16  Temp: 98.1 F (36.7 C) 98 F (36.7 C)  SpO2: 99% 98%    Physical Exam: General:  resting comfortably Lungs:  nonlabored Incisions:  L groin incision intact and dry without hematoma or bleeding Extremities:  left foot warm and well perfused with 2+ DP/PT pulses   CBC    Component Value Date/Time   WBC 12.0 (H) 05/26/2024 0341   RBC 4.94 05/26/2024 0341   HGB 13.9 05/26/2024 0341   HGB 16.6 03/19/2020 0953   HCT 40.6 05/26/2024 0341   HCT 49.3 03/19/2020 0953   PLT 229 05/26/2024 0341   PLT 311 03/19/2020 0953   MCV 82.2 05/26/2024 0341   MCV 84 03/19/2020 0953   MCH 28.1 05/26/2024 0341   MCHC 34.2 05/26/2024 0341   RDW 14.6 05/26/2024 0341   RDW 14.3 03/19/2020 0953   LYMPHSABS 2.5 05/23/2024 1437   LYMPHSABS 2.2 03/19/2020 0953   MONOABS 1.3 (H) 05/23/2024 1437   EOSABS 0.0 05/23/2024 1437   EOSABS 0.2 03/19/2020 0953   BASOSABS 0.1 05/23/2024 1437   BASOSABS 0.1 03/19/2020 0953    BMET    Component Value Date/Time   NA 135 05/26/2024 0341   NA 139 03/19/2020 0953   K 3.6 05/26/2024 0341   CL 104 05/26/2024 0341   CO2 21 (L) 05/26/2024 0341   GLUCOSE 120 (H) 05/26/2024 0341   BUN 13 05/26/2024 0341   BUN 13 03/19/2020 0953   CREATININE 1.00 05/26/2024 0341   CREATININE 1.02 10/11/2021 0901   CALCIUM 9.1 05/26/2024 0341   GFRNONAA >60 05/26/2024 0341   GFRAA 106 03/19/2020 0953    INR    Component Value Date/Time   INR 1.1 05/24/2024 0556     Intake/Output Summary (Last 24 hours) at 05/26/2024 0805 Last data filed at 05/26/2024 0200 Gross per 24 hour  Intake 1646.73 ml  Output 100 ml  Net 1546.73 ml      Assessment/Plan:  47 y.o. male is 1 day post op,  s/p: left common femoral endarterectomy plus left common femoral, profunda, and SFA thrombectomy with bovine pericardial patch angioplasty    -He is feeling great this morning. He endorses mild left groin pain as expected postop -L groin incision is intact, soft, and dry without hematoma -LLE is well perfused with 2+ DP/PT pulses  -He has ambulated up and down the hallway without any left leg claudication -Currently on heparin gtt. We recommend 3 months of DOAC and ASA 81mg  post op. If further embolic testing is negative including today's echo, he can transition to ASA 81mg  daily after 3 months -Stable for discharge from vascular perspective. We will arrange follow up in 2-3 wks for incision check   Deneise Finlay, PA-C [facility] [phone] 05/26/2024 8:05 AM  I have seen and evaluated the patient. I agree with the PA note as documented above.  Postop day 1 status post left common femoral endarterectomy with left lower extremity thrombectomy.  Left groin looks good.  Palpable PT pulse at the ankle.  Heparin restarted last night.  Needs echo to complete cardioembolic workup.  I suspect this is likely due to  a plaque rupture in the left common femoral artery.  Pending echo findings if this is negative would need DOAC for 3 months and aspirin.  After 3 months we will go to aspirin alone.  Will arrange follow-up in 2 to 3 weeks for incision check.  Dr. Rosalva Comber also wants patient to have holter monitor as outpatient.  Young Hensen, MD [facility] [place] Office: [phone]

## 2024-05-26 NOTE — Discharge Instructions (Addendum)
 For help with stopping smoking please call 1-800-QUIT-NOW to receive free patches and other assistance to help with smoking cessation.        [facility]  Discharge instructions  Lower Extremity Surgery  Please refer to the following instruction for your post-procedure care. Your surgeon or physician assistant will discuss any changes with you.  Activity  You are encouraged to walk as much as you can. You can slowly return to normal activities during the month after your surgery. Avoid strenuous activity and heavy lifting until your doctor tells you it's OK. Avoid activities such as vacuuming or swinging a golf club. Do not drive until your doctor give the OK and you are no longer taking prescription pain medications. It is also normal to have difficulty with sleep habits, eating and bowel movement after surgery. These will go away with time.  Bathing/Showering  Shower daily after you go home. Do not soak in a bathtub, hot tub, or swim until the incision heals completely.  Incision Care  Clean your incision with mild soap and water. Shower every day. Pat the area dry with a clean towel. You do not need a bandage unless otherwise instructed. Do not apply any ointments or creams to your incision. If you have open wounds you will be instructed how to care for them or a visiting nurse may be arranged for you. If you have staples or sutures along your incision they will be removed at your post-op appointment. You may have skin glue on your incision. Do not peel it off. It will come off on its own in about one week.   Diet  Resume your normal diet. There are no special food restrictions following this procedure. A low fat/ low cholesterol diet is recommended for all patients with vascular disease. In order to heal from your surgery, it is CRITICAL to get adequate nutrition. Your body requires vitamins, minerals, and protein. Vegetables are the best source  of vitamins and minerals. Vegetables also provide the perfect balance of protein. Processed food has little nutritional value, so try to avoid this.  Medications  Please take your aspirin and eliquis for 3 months. Resume taking all your medications unless your doctor or physician assistant tells you not to. If your incision is causing pain, you may take over-the-counter pain relievers such as acetaminophen  (Tylenol ). If you were prescribed a stronger pain medication, please aware these medication can cause nausea and constipation. Prevent nausea by taking the medication with a snack or meal. Avoid constipation by drinking plenty of fluids and eating foods with high amount of fiber, such as fruits, vegetables, and grains. Take Colace 100 mg (an over-the-counter stool softener) twice a day as needed for constipation.  Do not take Tylenol  if you are taking prescription pain medications.  Follow Up  Our office will schedule a follow up appointment 2-3 weeks following discharge.  Please call us  immediately for any of the following conditions  -Severe or worsening pain in your legs or feet while at rest or while walking  -Increased pain, redness, warmth, or drainage (pus) from your incision site(s) -Fever of 101 degree or higher  Leg swelling is common after revascularization surgery.  The swelling should improve over a few months following surgery. To improve the swelling, you may elevate your legs above the level of your heart while you are sitting or resting. Your surgeon or physician assistant may ask you to apply an ACE wrap or wear compression (TED)  stockings to help to reduce swelling.  Reduce your risk of vascular disease  Stop smoking. If you would like help call [facility] at 1-800-QUIT-NOW (579-616-7019) or [place] at [phone].  Manage your cholesterol Maintain a desired weight Control your diabetes weight Control your diabetes Keep your blood pressure down  If you have  any questions, please call the office at [phone]      Information on my medicine - ELIQUIS (apixaban)   Why was Eliquis prescribed for you? Eliquis was prescribed to treat blood clots that may have been found in the veins of your legs (deep vein thrombosis) or in your lungs (pulmonary embolism) and to reduce the risk of them occurring again.  What do You need to know about Eliquis ? The dose is ONE 5 mg tablet taken TWICE daily.  Eliquis may be taken with or without food.   Try to take the dose about the same time in the morning and in the evening. If you have difficulty swallowing the tablet whole please discuss with your pharmacist how to take the medication safely.  Take Eliquis exactly as prescribed and DO NOT stop taking Eliquis without talking to the doctor who prescribed the medication.  Stopping may increase your risk of developing a new blood clot.  Refill your prescription before you run out.  After discharge, you should have regular check-up appointments with your healthcare provider that is prescribing your Eliquis.    What do you do if you miss a dose? If a dose of ELIQUIS is not taken at the scheduled time, take it as soon as possible on the same day and twice-daily administration should be resumed. The dose should not be doubled to make up for a missed dose.  Important Safety Information A possible side effect of Eliquis is bleeding. You should call your healthcare provider right away if you experience any of the following: Bleeding from an injury or your nose that does not stop. Unusual colored urine (red or dark brown) or unusual colored stools (red or black). Unusual bruising for unknown reasons. A serious fall or if you hit your head (even if there is no bleeding).  Some medicines may interact with Eliquis and might increase your risk of bleeding or clotting while on Eliquis. To help avoid this, consult your healthcare provider or pharmacist prior to  using any new prescription or non-prescription medications, including herbals, vitamins, non-steroidal anti-inflammatory drugs (NSAIDs) and supplements.  This website has more information on Eliquis (apixaban): http://www.eliquis.com/eliquis/home

## 2024-05-26 NOTE — Plan of Care (Signed)
  Problem: Education: Goal: Knowledge of General Education information will improve Description: Including pain rating scale, medication(s)/side effects and non-pharmacologic comfort measures Outcome: Adequate for Discharge   Problem: Health Behavior/Discharge Planning: Goal: Ability to manage health-related needs will improve Outcome: Adequate for Discharge   Problem: Clinical Measurements: Goal: Ability to maintain clinical measurements within normal limits will improve Outcome: Adequate for Discharge Goal: Will remain free from infection Outcome: Adequate for Discharge Goal: Diagnostic test results will improve Outcome: Adequate for Discharge Goal: Respiratory complications will improve Outcome: Adequate for Discharge Goal: Cardiovascular complication will be avoided Outcome: Adequate for Discharge   Problem: Nutrition: Goal: Adequate nutrition will be maintained Outcome: Adequate for Discharge   Problem: Coping: Goal: Level of anxiety will decrease Outcome: Adequate for Discharge   Problem: Elimination: Goal: Will not experience complications related to urinary retention Outcome: Adequate for Discharge   Problem: Pain Managment: Goal: General experience of comfort will improve and/or be controlled Outcome: Adequate for Discharge   Problem: Safety: Goal: Ability to remain free from injury will improve Outcome: Adequate for Discharge   Problem: Skin Integrity: Goal: Risk for impaired skin integrity will decrease Outcome: Adequate for Discharge

## 2024-05-26 NOTE — Progress Notes (Signed)
 ANTICOAGULATION CONSULT NOTE  Pharmacy Consult for Heparin Indication: limb ischemia  No Known Allergies  Patient Measurements: Height: 5' 9.5 (176.5 cm) Weight: 63.5 kg (139 lb 15.9 oz) IBW/kg (Calculated) : 71.85 Heparin Dosing Weight: 63.5 kg  Vital Signs: Temp: 98.1 F (36.7 C) (06/12 0321) Temp Source: Oral (06/12 0321) BP: 136/72 (06/12 0321) Pulse Rate: 70 (06/12 0321)  Labs: Recent Labs    05/23/24 1437 05/23/24 1502 05/23/24 1625 05/23/24 1631 05/24/24 0556 05/24/24 1010 05/24/24 2040 05/25/24 0545 05/26/24 0341  HGB 17.0 17.3*  --  17.3* 16.8  --   --   --  13.9  HCT 49.9 51.0  --  51.0 48.5  --   --   --  40.6  PLT 302  --   --   --  275  --   --   --  229  APTT  --   --  35  --  76*  --   --   --   --   LABPROT  --   --  13.7  --  14.5  --   --   --   --   INR  --   --  1.0  --  1.1  --   --   --   --   HEPARINUNFRC  --   --   --   --  0.39   < > 0.56 >1.10* 0.59  CREATININE 1.01 1.10  --  1.10 1.04  --   --   --   --    < > = values in this interval not displayed.    Estimated Creatinine Clearance: 77.2 mL/min (by C-G formula based on SCr of 1.04 mg/dL).  Assessment: 58 yom with a history of anxiety, depression, HLD. Patient is presenting with leg pain x 3 weeks. Heparin per pharmacy consult placed for limb ischemia.  Patient is not on anticoagulation prior to arrival. Hgb 17.3; plt 302  Issues with heparin infusion running 6/09 PM. According to the patient the IV beeped all night, which likely means it was not running well. 6/10 PM  heparin level 0.56 therapeutic on 1200 units/hr.  No issues per RN. Repeat heparin level now above goal >1.1. Patient off the floor, unable to assess line or possible lab error.  Per Vascular surgery note, plan is for L LE thrombectomy with Dr. Fulton Job in OR today and to continue heparin.   6/11 PM - s/p successful L fem thrombectomy and endarterectomy with bovine patch.  Heparin to start @2100  per [facility].  6/12 AM update:   Heparin level therapeutic   Goal of Therapy:  Heparin level 0.3-0.7 units/ml Monitor platelets by anticoagulation protocol: Yes   Plan:  Cont heparin 1200 units/hr 8 hour heparin level Daily heparin level, CBC Continue to monitor H&H and platelets  Silvestre Drum, PharmD, BCPS Clinical Pharmacist Phone: [phone]

## 2024-05-26 NOTE — Telephone Encounter (Signed)
Patient Product/process development scientist completed.    The patient is insured through [facility].     Ran test claim for Eliquis 5 mg and the current 30 day co-pay is $4.00.  Ran test claim for Xarelto 20 mg and the current 30 day co-pay is $4.00.  This test claim was processed through [facility]- copay amounts may vary at other pharmacies due to pharmacy/plan contracts, or as the patient moves through the different stages of their insurance plan.     Roland Earl, CPHT Pharmacy Technician III Certified Patient Advocate [facility] Patient Advocate Team Direct Number: [phone]  Fax: [phone]

## 2024-05-27 ENCOUNTER — Other Ambulatory Visit (HOSPITAL_COMMUNITY): Payer: Self-pay

## 2024-05-30 ENCOUNTER — Other Ambulatory Visit: Payer: Self-pay | Admitting: Student

## 2024-05-30 ENCOUNTER — Other Ambulatory Visit: Payer: Self-pay | Admitting: Cardiology

## 2024-05-30 DIAGNOSIS — I70202 Unspecified atherosclerosis of native arteries of extremities, left leg: Secondary | ICD-10-CM

## 2024-05-30 DIAGNOSIS — I4891 Unspecified atrial fibrillation: Secondary | ICD-10-CM

## 2024-05-30 NOTE — Discharge Summary (Signed)
 Physician Discharge Summary   Patient: Zachary Rodriguez MRN: [id number] DOB: 1976/03/19  Admit date:     05/23/2024  Discharge date: 05/26/2024  Discharge Physician: Joette Mustard   PCP: Pcp, No   Recommendations at discharge:    F/u cbc ensure wbc count normalized, BMP to ensure serum creatinine normalized Needs holter monitor   Discharge Diagnoses: Principal Problem:   Femoral artery occlusion, left (HCC) Active Problems:   PVD (peripheral vascular disease) (HCC)   Generalized anxiety disorder   Depression, recurrent (HCC)   Continuous dependence on cigarette smoking   Hyperlipidemia  Resolved Problems:   * No resolved hospital problems. *  Hospital Course: [name] is a 48 y.o. male with medical history significant of chronic smoking cigarette, generalized anxiety disorder, hyperlipidemia, history of ventral hernia repair, chronic abdominal pain and nausea presented emergency department complaining of left leg pain.  Patient reported left-sided leg pain started with short distance walking.  See H&P for full HPI on admission & ED course.   Patient was found to have complete occlusion of the left common femoral artery.  He was admitted to medicine service with vascular surgery consulted.  Plan is for surgical intervention, likely Friday 6/13.     Patient is on heparin  with pain improved since admission.  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  Physical exam revealed left-sided lower extremity cool to touch and unable to appreciate any peripheral pulses of left lower extremity. - CT abdomen angio runoff of bilateral lower extremity showed complete occlusion of the left common femoral  artery with reconstitution just above the common femoral artery bifurcation. Some clot/thrombus extends into the proximal left superficial femoral artery. Below this, the superficial femoral artery and popliteal artery are widely patent. Three-vessel runoff bilaterally with dominant vessel posterior tibial artery. -CT abdomen pelvis and chest no acute abnormality of the chest.  Aortic atherosclerosis and Mild irregular mixed density plaque in the descending aorta. No penetrating atherosclerotic ulcer or ulcerated plaque. Now S/p L CFA thrombectomy and thrombectomy of L profunda and SFA, L common femoral endarterectomy 6/11 TTE normal EF, without evidence of mural or apical thrombus.  --Continue ASA, Lipitor  --Continue eliquis   --Echo ordered per vascular to assess for any mobile thrombus - Counseled and encouraged patient for cessation of smoking, he expresses understanding and plans to quit.  Generalized anxiety disorder History of depression -Pt reported taking Buspar  PRN at home, not prescribed recently however --Psychiatry was consulted on admission, agreed with Buspar  7.5 mg BID  --Discharged on this  --Outpatient follow up   Tobacco use disorder  -Continue nicotine  patch - Counseled and encouraged patient for cessation of smoking.  Will need continued counseling for cessation on discharge.    Prediabetes  Nothing to do while inpt   Emphysema - CT chest showing emphysematous change in the setting of chronic tobacco use Not currently requiring oxygen or bronchodilators  -- counseled on tobacco cessation.   Leukocytosis  In the setting of LE arterial occluision/ recent procedure. Low suspicion for infection. Will need repeat cbc to ensure resolution with PCP.       Pain control - [place]  Controlled Substance Reporting System database was reviewed. and patient was instructed, not to drive, operate heavy machinery, perform activities at heights, swimming  or  participation in water activities or provide baby-sitting services while on Pain, Sleep and Anxiety Medications; until their outpatient Physician has advised to do so again. Also recommended to not to take more than prescribed Pain, Sleep and Anxiety Medications.  Consultants: Vascular surgery  Procedures performed:  s/p: left common femoral endarterectomy plus left common femoral, profunda, and SFA thrombectomy with bovine pericardial patch angioplasty (6/11)  Disposition: Home Diet recommendation:  Discharge Diet Orders (From admission, onward)     Start     Ordered   05/26/24 0000  Diet - low sodium heart healthy        05/26/24 1442           Cardiac diet DISCHARGE MEDICATION: Allergies as of 05/26/2024   No Known Allergies      Medication List     STOP taking these medications    ibuprofen  200 MG tablet Commonly known as: ADVIL    ondansetron  4 MG tablet Commonly known as: Zofran    sucralfate  1 g tablet Commonly known as: CARAFATE        TAKE these medications    aspirin  EC 81 MG tablet Take 1 tablet (81 mg total) by mouth daily. Swallow whole.   atorvastatin  80 MG tablet Commonly known as: LIPITOR  Take 1 tablet (80 mg total) by mouth daily.   busPIRone  7.5 MG tablet Commonly known as: BUSPAR  Take 1 tablet (7.5 mg total) by mouth 2 (two) times daily.   Eliquis  5 MG Tabs tablet Generic drug: apixaban  Take 1 tablet (5 mg total) by mouth 2 (two) times daily.   oxyCODONE  5 MG immediate release tablet Commonly known as: Oxy IR/ROXICODONE  Take 1 tablet (5 mg total) by mouth every 6 (six) hours as needed for up to 7 days for moderate pain (pain score 4-6).   polyethylene glycol 17 g packet Commonly known as: MiraLax  Take 17 g by mouth daily.   senna 8.6 MG Tabs tablet Commonly known as: SENOKOT Take 1 tablet (8.6 mg total) by mouth daily.        Follow-up Information     [facility] Follow up  in 3 week(s).   Specialty: Vascular Surgery Why: Office will call to arrange your appt(s) Contact information: [address] [place]  [address] [phone]               Discharge Exam: Filed Weights   05/23/24 1434 05/25/24 0921  Weight: 63.5 kg 63.5 kg   Physical Exam  Constitutional: In no distress.  Cardiovascular: Normal rate, regular rhythm. No lower extremity edema  Pulmonary: Non labored breathing on room air, no wheezing or rales.   Abdominal: Soft. Normal bowel sounds. Non distended and non tender Musculoskeletal: Normal range of motion.     Neurological: Alert and oriented to person, place, and time. Non focal  Skin: Skin is warm and dry. Incision site c/d/I, no drainage, no erythema of overlying skin no swelling.    Condition at discharge: good  The results of significant diagnostics from this hospitalization (including imaging, microbiology, ancillary and laboratory) are listed below for reference.   Imaging Studies: ECHOCARDIOGRAM COMPLETE Result Date: 05/26/2024    ECHOCARDIOGRAM REPORT   Patient Name:   YERICK EGGEBRECHT Date of Exam: 05/26/2024 Medical Rec #:  [id number]      Height:       69.5 in Accession #:    [id number]  Weight:       140.0 lb Date of Birth:  1976-06-01      BSA:          1.784 m Patient Age:    48 years       BP:           136/72 mmHg Patient Gender: M              HR:           61 bpm. Exam Location:  Inpatient Procedure: Intracardiac Opacification Agent, 2D Echo, Color Doppler and Cardiac            Doppler (Both Spectral and Color Flow Doppler were utilized during            procedure). Indications:    other abnormalities of the heart  History:        Patient has no prior history of Echocardiogram examinations.  Sonographer:    Griselda Lederer Referring Phys: [id number] KELLY A GRIFFITH IMPRESSIONS  1. Left ventricular ejection fraction, by estimation, is 55 to 60%. The left ventricle has normal function. The left  ventricle has no regional wall motion abnormalities. Left ventricular diastolic parameters were normal.  2. Right ventricular systolic function is normal. The right ventricular size is normal.  3. The mitral valve is normal in structure. Trivial mitral valve regurgitation. No evidence of mitral stenosis.  4. The aortic valve is grossly normal. Aortic valve regurgitation is not visualized. No aortic stenosis is present.  5. The inferior vena cava is normal in size with greater than 50% respiratory variability, suggesting right atrial pressure of 3 mmHg. Comparison(s): No prior Echocardiogram. Conclusion(s)/Recommendation(s): Normal biventricular function without evidence of hemodynamically significant valvular heart disease. No left ventricular mural or apical thrombus/thrombi. FINDINGS  Left Ventricle: Left ventricular ejection fraction, by estimation, is 55 to 60%. The left ventricle has normal function. The left ventricle has no regional wall motion abnormalities. Definity  contrast agent was given IV to delineate the left ventricular  endocardial borders. The left ventricular internal cavity size was normal in size. There is no left ventricular hypertrophy. Left ventricular diastolic parameters were normal. Right Ventricle: The right ventricular size is normal. No increase in right ventricular wall thickness. Right ventricular systolic function is normal. Left Atrium: Left atrial size was normal in size. Right Atrium: Right atrial size was normal in size. Pericardium: There is no evidence of pericardial effusion. Mitral Valve: The mitral valve is normal in structure. Trivial mitral valve regurgitation. No evidence of mitral valve stenosis. Tricuspid Valve: The tricuspid valve is normal in structure. Tricuspid valve regurgitation is trivial. No evidence of tricuspid stenosis. Aortic Valve: The aortic valve is grossly normal. Aortic valve regurgitation is not visualized. No aortic stenosis is present. Aortic valve  mean gradient measures 3.0 mmHg. Aortic valve peak gradient measures 5.5 mmHg. Aortic valve area, by VTI measures 2.79 cm. Pulmonic Valve: The pulmonic valve was not well visualized. Pulmonic valve regurgitation is not visualized. No evidence of pulmonic stenosis. Aorta: The aortic root, ascending aorta, aortic arch and descending aorta are all structurally normal, with no evidence of dilitation or obstruction. Venous: The inferior vena cava is normal in size with greater than 50% respiratory variability, suggesting right atrial pressure of 3 mmHg. IAS/Shunts: The atrial septum is grossly normal.  LEFT VENTRICLE PLAX 2D LVIDd:         5.00 cm      Diastology LVIDs:         3.60 cm  LV e' medial:    11.00 cm/s LV PW:         1.00 cm      LV E/e' medial:  5.0 LV IVS:        0.80 cm      LV e' lateral:   13.40 cm/s LVOT diam:     2.20 cm      LV E/e' lateral: 4.1 LV SV:         65 LV SV Index:   37 LVOT Area:     3.80 cm  LV Volumes (MOD) LV vol d, MOD A2C: 103.0 ml LV vol d, MOD A4C: 116.0 ml LV vol s, MOD A2C: 36.8 ml LV vol s, MOD A4C: 46.1 ml LV SV MOD A2C:     66.2 ml LV SV MOD A4C:     116.0 ml LV SV MOD BP:      68.6 ml RIGHT VENTRICLE             IVC RV Basal diam:  3.00 cm     IVC diam: 1.70 cm RV S prime:     12.70 cm/s TAPSE (M-mode): 2.4 cm LEFT ATRIUM             Index LA diam:        2.70 cm 1.51 cm/m LA Vol (A2C):   39.2 ml 21.97 ml/m LA Vol (A4C):   35.4 ml 19.84 ml/m LA Biplane Vol: 37.6 ml 21.07 ml/m  AORTIC VALVE AV Area (Vmax):    3.10 cm AV Area (Vmean):   3.00 cm AV Area (VTI):     2.79 cm AV Vmax:           117.00 cm/s AV Vmean:          76.600 cm/s AV VTI:            0.234 m AV Peak Grad:      5.5 mmHg AV Mean Grad:      3.0 mmHg LVOT Vmax:         95.50 cm/s LVOT Vmean:        60.400 cm/s LVOT VTI:          0.172 m LVOT/AV VTI ratio: 0.74  AORTA Ao Root diam: 3.10 cm Ao Asc diam:  3.60 cm MITRAL VALVE MV Area (PHT): 5.13 cm    SHUNTS MV Decel Time: 148 msec    Systemic VTI:  0.17  m MV E velocity: 55.30 cm/s  Systemic Diam: 2.20 cm MV A velocity: 47.10 cm/s MV E/A ratio:  1.17 Sheryle Donning MD Electronically signed by Sheryle Donning MD Signature Date/Time: 05/26/2024/12:43:43 PM    Final    CT ANGIO CHEST AORTA W/CM & OR WO/CM Result Date: 05/23/2024 CLINICAL DATA:  Arterial embolism suspected, lower extremity, determine source EXAM: CT ANGIOGRAPHY CHEST WITHOUT AND WITH CONTRAST TECHNIQUE: Multidetector CT imaging of the chest was performed using the standard protocol before and during bolus administration of intravenous contrast. Multiplanar CT image reconstructions and MIPs were obtained to evaluate the vascular anatomy. RADIATION DOSE REDUCTION: This exam was performed according to the departmental dose-optimization program which includes automated exposure control, adjustment of the mA and/or kV according to patient size and/or use of iterative reconstruction technique. CONTRAST:  75mL OMNIPAQUE  IOHEXOL  350 MG/ML SOLN COMPARISON:  None Available. FINDINGS: Cardiovascular: Normal caliber thoracic aorta without intramural hematoma, penetrating atherosclerotic ulcer, or dissection. Irregular mixed density atherosclerotic plaque in the descending thoracic aorta. No ulcerated plaque. Normal heart size. No  pericardial effusion. Negative for pulmonary embolism. Mediastinum/Nodes: Trachea and esophagus are unremarkable. No thoracic adenopathy. Lungs/Pleura: Centrilobular and paraseptal emphysema greatest in the upper lobes. Hypoventilation changes in the lower lobes. No focal consolidation, pleural effusion, or pneumothorax. Upper Abdomen: No acute abnormality. Musculoskeletal: No acute fracture. Review of the MIP images confirms the above findings. IMPRESSION: 1. No acute abnormality in the chest. 2. Aortic Atherosclerosis (ICD10-I70.0). Mild irregular mixed density plaque in the descending aorta. No penetrating atherosclerotic ulcer or ulcerated plaque. 3. Emphysema  (ICD10-J43.9). Electronically Signed   By: Rozell Cornet M.D.   On: 05/23/2024 20:05   CT Angio Aortobifemoral W and/or Wo Contrast Result Date: 05/23/2024 CLINICAL DATA:  Claudication EXAM: CT ANGIOGRAPHY OF ABDOMINAL AORTA WITH ILIOFEMORAL RUNOFF TECHNIQUE: Multidetector CT imaging of the abdomen, pelvis and lower extremities was performed using the standard protocol during bolus administration of intravenous contrast. Multiplanar CT image reconstructions and MIPs were obtained to evaluate the vascular anatomy. RADIATION DOSE REDUCTION: This exam was performed according to the departmental dose-optimization program which includes automated exposure control, adjustment of the mA and/or kV according to patient size and/or use of iterative reconstruction technique. CONTRAST:  OMNIPAQUE  IOHEXOL  350 MG/ML SOLN COMPARISON:  None Available. FINDINGS: VASCULAR Aorta: Irregular calcified and noncalcified plaque throughout the aorta. No aneurysm or dissection. Celiac: Patent without evidence of aneurysm, dissection, vasculitis or significant stenosis. SMA: Patent without evidence of aneurysm, dissection, vasculitis or significant stenosis. Renals: Both renal arteries are patent without evidence of aneurysm, dissection, vasculitis, fibromuscular dysplasia or significant stenosis. IMA: Patent without evidence of aneurysm, dissection, vasculitis or significant stenosis. RIGHT Lower Extremity Inflow: Calcified and noncalcified plaque throughout the common iliac arteries. External iliac artery and internal iliac artery widely patent. Outflow: Calcified plaque in the common femoral artery. No significant stenosis. Superficial and deep femoral arteries widely patent. Popliteal artery widely patent. Runoff: Three-vessel runoff to the ankle with dominant runoff the posterior tibial artery. Dorsalis pedis/anterior tibial artery is diminutive at the level of the ankle. LEFT Lower Extremity Inflow: Calcified and noncalcified  plaque throughout the common iliac artery. No significant stenosis. External iliac and internal iliac arteries widely patent. Outflow: Occlusion of the left common femoral artery. Reconstitution at the common femoral artery bifurcation with patent superficial and deep femoral arteries. Linear filling defect noted in the proximal superficial femoral artery, likely extension of thrombus from the occlusive thrombus in the common femoral artery. Mid and distal superficial femoral artery widely patent. Popliteal artery widely patent. Runoff: Three-vessel runoff to the ankle with the dominant vessel being the posterior tibial artery. Veins: No obvious venous abnormality within the limitations of this arterial phase study. Review of the MIP images confirms the above findings. NON-VASCULAR Lower chest: Ground-glass opacities dependently in the lower lobes, likely dependent atelectasis. No effusions. Hepatobiliary: No focal hepatic abnormality. Gallbladder unremarkable. Pancreas: No focal abnormality or ductal dilatation. Spleen: No focal abnormality.  Normal size. Adrenals/Urinary Tract: No adrenal abnormality. No focal renal abnormality. No stones or hydronephrosis. Urinary bladder is unremarkable. Stomach/Bowel: Motion artifact degrades image quality and limits evaluation of bowel in the mid abdomen. No visible bowel obstruction or inflammatory process. Moderate stool throughout the colon. Lymphatic: No adenopathy Reproductive: No visible focal abnormality. Other: No free fluid or free air. Musculoskeletal: No acute bony abnormality. IMPRESSION: VASCULAR Calcified and noncalcified irregular plaque throughout the aorta and iliac vessels bilaterally. 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 Some clot/thrombus extends into the proximal left  superficial femoral artery. Below this, the superficial femoral artery and popliteal artery are widely  patent. Three-vessel runoff bilaterally with dominant vessel posterior tibial artery. NON-VASCULAR No acute findings in the abdomen or pelvis. Dependent atelectasis in the lower lobes. Moderate stool burden. Electronically Signed   By: Janeece Mechanic M.D.   On: 05/23/2024 18:09    Microbiology: Results for orders placed or performed during the hospital encounter of 05/23/24  Surgical pcr screen     Status: None   Collection Time: 05/25/24  9:19 AM   Specimen: Nasal Mucosa; Nasal Swab  Result Value Ref Range Status   MRSA, PCR NEGATIVE NEGATIVE Final   Staphylococcus aureus NEGATIVE NEGATIVE Final    Comment: (NOTE) The Xpert SA Assay (FDA approved for NASAL specimens in patients 70 years of age and older), is one component of a comprehensive surveillance program. It is not intended to diagnose infection nor to guide or monitor treatment. Performed at [facility], [address], [place], [place] [address]   MRSA Next Gen by PCR, Nasal     Status: None   Collection Time: 05/25/24  9:19 AM   Specimen: Nasal Mucosa; Nasal Swab  Result Value Ref Range Status   MRSA by PCR Next Gen NOT DETECTED NOT DETECTED Final    Comment: (NOTE) The GeneXpert MRSA Assay (FDA approved for NASAL specimens only), is one component of a comprehensive MRSA colonization surveillance program. It is not intended to diagnose MRSA infection nor to guide or monitor treatment for MRSA infections. Test performance is not FDA approved in patients less than 59 years old. Performed at [facility], [address] [place], [place] [address]     Labs: CBC: Recent Labs  Lab 05/23/24 1437 05/23/24 1502 05/23/24 1631 05/24/24 0556 05/26/24 0341  WBC 12.7*  --   --  9.0 12.0*  NEUTROABS 8.8*  --   --   --   --   HGB 17.0 17.3* 17.3* 16.8 13.9  HCT 49.9 51.0 51.0 48.5 40.6  MCV 81.9  --   --  81.8 82.2  PLT 302  --   --  275 229   Basic Metabolic Panel: Recent Labs  Lab 05/23/24 1437  05/23/24 1502 05/23/24 1631 05/24/24 0556 05/26/24 0341  NA 137 139 140 136 135  K 3.7 3.8 3.8 3.5 3.6  CL 107 106 107 105 104  CO2 19*  --   --  19* 21*  GLUCOSE 130* 134* 109* 116* 120*  BUN 19 22* 20 16 13   CREATININE 1.01 1.10 1.10 1.04 1.00  CALCIUM  10.0  --   --  9.1 9.1   Liver Function Tests: Recent Labs  Lab 05/23/24 1437 05/24/24 0556  AST 18 17  ALT 17 15  ALKPHOS 74 71  BILITOT 1.2 1.2  PROT 7.3 6.8  ALBUMIN  4.5 4.0   CBG: No results for input(s): GLUCAP in the last 168 hours.  Discharge time spent: greater than 30 minutes.  Signed: Joette Mustard, MD [facility] 05/30/2024

## 2024-05-30 NOTE — Progress Notes (Signed)
 Amb referral sent to cardiology for holter monitor.

## 2024-06-16 ENCOUNTER — Encounter: Admitting: Vascular Surgery

## 2024-06-28 ENCOUNTER — Ambulatory Visit: Attending: Vascular Surgery | Admitting: Physician Assistant

## 2024-06-28 VITALS — BP 138/79 | HR 60 | Temp 97.4°F | Resp 18 | Ht 69.5 in | Wt 142.0 lb

## 2024-06-28 DIAGNOSIS — I70202 Unspecified atherosclerosis of native arteries of extremities, left leg: Secondary | ICD-10-CM

## 2024-06-28 DIAGNOSIS — I739 Peripheral vascular disease, unspecified: Secondary | ICD-10-CM

## 2024-06-28 NOTE — Progress Notes (Signed)
  POST OPERATIVE OFFICE NOTE    CC:  F/u for surgery  HPI:  This is a 48 y.o. male who is s/p left common femoral artery thrombectomy with femoral endarterectomy and bovine patch angioplasty by Dr. Gretta on 05/25/2024 due to acute limb ischemia.  He had complete resolution of symptoms.  He believes his groin incision has completely healed.  He is taking Eliquis  and aspirin .  He is scheduled to see cardiology in the near future to discuss Holter monitor results.  No Known Allergies  Current Outpatient Medications  Medication Sig Dispense Refill   apixaban  (ELIQUIS ) 5 MG TABS tablet Take 1 tablet (5 mg total) by mouth 2 (two) times daily. 180 tablet 0   aspirin  EC 81 MG tablet Take 1 tablet (81 mg total) by mouth daily. Swallow whole. 90 tablet 1   atorvastatin  (LIPITOR ) 80 MG tablet Take 1 tablet (80 mg total) by mouth daily. 90 tablet 0   busPIRone  (BUSPAR ) 7.5 MG tablet Take 1 tablet (7.5 mg total) by mouth 2 (two) times daily. 180 tablet 0   docusate sodium  (COLACE) 100 MG capsule Take 100 mg by mouth 2 (two) times daily.     senna (SENOKOT) 8.6 MG TABS tablet Take 1 tablet (8.6 mg total) by mouth daily. (Patient taking differently: Take 1 tablet by mouth every other day.) 30 tablet 0   polyethylene glycol (MIRALAX ) 17 g packet Take 17 g by mouth daily. (Patient not taking: Reported on 06/28/2024)     No current facility-administered medications for this visit.     ROS:  See HPI  Physical Exam:  Vitals:   06/28/24 1054  BP: 138/79  Pulse: 60  Resp: 18  Temp: (!) 97.4 F (36.3 C)  TempSrc: Oral  Weight: 142 lb (64.4 kg)  Height: 5' 9.5 (1.765 m)    Incision:  L groin healed Extremities:  palpable PT pulse LLE  Assessment/Plan:  This is a 48 y.o. male who is s/p: Left leg thrombectomy with common femoral artery endarterectomy and bovine patch angioplasty due to acute limb ischemia  Left foot well-perfused with a palpable PT pulse.  Left groin incision has completely  healed.  He is without rest pain or claudication.  This was believed to be caused by a ruptured posterior plaque in the common femoral artery.  He will continue Eliquis  for a total of 3 months postoperatively at which point he can discontinue Eliquis  and take aspirin  alone.  No restrictions from a vascular surgery standpoint.  We will check ABIs in 1 year.  He will notify the office with any questions or concerns in the meantime.   Donnice Sender, PA-C [facility] [phone]  Clinic MD:  Magda

## 2024-07-04 ENCOUNTER — Ambulatory Visit: Attending: Cardiology

## 2024-07-04 DIAGNOSIS — I4891 Unspecified atrial fibrillation: Secondary | ICD-10-CM

## 2024-07-04 DIAGNOSIS — I70202 Unspecified atherosclerosis of native arteries of extremities, left leg: Secondary | ICD-10-CM

## 2024-07-09 DIAGNOSIS — I4891 Unspecified atrial fibrillation: Secondary | ICD-10-CM | POA: Diagnosis not present

## 2024-07-09 DIAGNOSIS — I70202 Unspecified atherosclerosis of native arteries of extremities, left leg: Secondary | ICD-10-CM

## 2024-07-11 ENCOUNTER — Ambulatory Visit: Payer: Self-pay | Admitting: Cardiology

## 2024-07-14 NOTE — Telephone Encounter (Signed)
 Left message to call back

## 2024-07-14 NOTE — Telephone Encounter (Signed)
-----   Message from Rollo FABIENE Louder sent at 07/11/2024  6:57 AM EDT ----- Please tell patient that his recent monitor showed predominantly normal sinus rhythm with average HR 78 BPM. There was no atrial fibrillation noted. He does have a high burden of PACs (extra heart  beats from the top of the heart, not dangerous). To help prevent PACs, he should make sure he is staying hydrated, cut back on caffeine and alcohol intake, and stop smoking cigarettes. If he is  having palpitations, we can discuss starting a medication to help suppress these extra beats once he is seen in clinic   Thanks Kj  ----- Message ----- From: Kate Lonni CROME, MD Sent: 07/09/2024   2:53 PM EDT To: Rollo JONELLE Louder, PA-C

## 2024-07-15 NOTE — Telephone Encounter (Signed)
 Pt returning call for results

## 2024-07-18 NOTE — Telephone Encounter (Signed)
-----   Message from Rollo FABIENE Louder sent at 07/11/2024  6:57 AM EDT ----- Please tell patient that his recent monitor showed predominantly normal sinus rhythm with average HR 78 BPM. There was no atrial fibrillation noted. He does have a high burden of PACs (extra heart  beats from the top of the heart, not dangerous). To help prevent PACs, he should make sure he is staying hydrated, cut back on caffeine and alcohol intake, and stop smoking cigarettes. If he is  having palpitations, we can discuss starting a medication to help suppress these extra beats once he is seen in clinic   Thanks Kj  ----- Message ----- From: Kate Lonni CROME, MD Sent: 07/09/2024   2:53 PM EDT To: Rollo JONELLE Louder, PA-C

## 2024-07-18 NOTE — Telephone Encounter (Signed)
 Called patient advised of below they verbalized understanding.

## 2024-07-23 NOTE — Progress Notes (Signed)
 Cardiology Office Note   Date:  08/02/2024  ID:  Zachary Rodriguez, DOB February 06, 1976, MRN [id number] PCP: Deitra Morton Sebastian Nena, NP  [facility] Providers Cardiologist:  Newman JINNY Lawrence, MD    History of Present Illness Zachary Rodriguez is a 48 y.o. male with past medical history of generalized anxiety disorder, depression, tobacco use, hyperlipidemia, recent femoral artery occlusion who presents today for monitor follow-up and to establish care with cardiology.  Patient was admitted 6/9 - 05/26/2024.  He had presented complaining of left-sided leg pain that started while walking short distance.  On exam in the ED, left lower extremity was cool to the touch and unable to appreciate peripheral pulses.  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  Patient was started on Eliquis , remained on aspirin  and Lipitor .  Echocardiogram 05/26/2024 showed EF 55-60%, no regional wall motion abnormalities, normal RV systolic function, no significant valvular abnormalities.  No source of thrombus noted.  During that admission patient was seen by vascular surgery.  Underwent femoral endarterectomy and bovine patch angioplasty by Dr. Gretta on 05/25/2024.  Had complete resolution of symptoms.  Seen by vascular surgery on 06/28/2024 and was doing well.  No rest pain or claudication.  Felt that his occlusion was due to to ruptured posterior plaque in the common femoral artery.  Recommended continuing Eliquis  for total of 3 months postoperatively, at which point he can discontinue Eliquis  and remain on aspirin  alone.  Patient wore a cardiac monitor that showed predominantly normal sinus rhythm with average heart rate 78 bpm, no atrial fibrillation, sustained ventricular tachycardia, significant pauses, or heart grade AV block.  Did have 12% PAC burden.  On interview, patient reports that he has been doing well since his hospitalization.  He denies pain in his left leg.   Occasionally wakes up with cramping in his right leg.  Denies claudication symptoms in either leg. Has been compliant with eliquis  and will complete 3 months of therapy on 9/11.  He denies chest pain.  Does have occasional palpitations.  Feels a skipped beat.  He previously had these in 2017 when going through a significant and stressful life event.  He continues to have them now, more significant when he is emotionally stressed.  Denies syncope or near syncope.  We discussed echocardiogram and monitor results   Studies Reviewed Cardiac Studies & Procedures   ______________________________________________________________________________________________     ECHOCARDIOGRAM  ECHOCARDIOGRAM COMPLETE 05/26/2024  Narrative ECHOCARDIOGRAM REPORT    Patient Name:   Zachary Rodriguez Date of Exam: 05/26/2024 Medical Rec #:  [id number]      Height:       69.5 in Accession #:    [id number]     Weight:       140.0 lb Date of Birth:  [birth year]      BSA:          1.784 m Patient Age:    48 years       BP:           136/72 mmHg Patient Gender: M              HR:           61 bpm. Exam Location:  Inpatient  Procedure: Intracardiac Opacification Agent, 2D Echo, Color Doppler and Cardiac Doppler (Both Spectral and Color Flow Doppler were utilized during procedure).  Indications:    other abnormalities of the heart  History:  Patient has no prior history of Echocardiogram examinations.  Sonographer:    Therisa Crouch Referring Phys: [id number] KELLY A GRIFFITH  IMPRESSIONS   1. Left ventricular ejection fraction, by estimation, is 55 to 60%. The left ventricle has normal function. The left ventricle has no regional wall motion abnormalities. Left ventricular diastolic parameters were normal. 2. Right ventricular systolic function is normal. The right ventricular size is normal. 3. The mitral valve is normal in structure. Trivial mitral valve regurgitation. No evidence of mitral stenosis. 4. The  aortic valve is grossly normal. Aortic valve regurgitation is not visualized. No aortic stenosis is present. 5. The inferior vena cava is normal in size with greater than 50% respiratory variability, suggesting right atrial pressure of 3 mmHg.  Comparison(s): No prior Echocardiogram.  Conclusion(s)/Recommendation(s): Normal biventricular function without evidence of hemodynamically significant valvular heart disease. No left ventricular mural or apical thrombus/thrombi.  FINDINGS Left Ventricle: Left ventricular ejection fraction, by estimation, is 55 to 60%. The left ventricle has normal function. The left ventricle has no regional wall motion abnormalities. Definity  contrast agent was given IV to delineate the left ventricular endocardial borders. The left ventricular internal cavity size was normal in size. There is no left ventricular hypertrophy. Left ventricular diastolic parameters were normal.  Right Ventricle: The right ventricular size is normal. No increase in right ventricular wall thickness. Right ventricular systolic function is normal.  Left Atrium: Left atrial size was normal in size.  Right Atrium: Right atrial size was normal in size.  Pericardium: There is no evidence of pericardial effusion.  Mitral Valve: The mitral valve is normal in structure. Trivial mitral valve regurgitation. No evidence of mitral valve stenosis.  Tricuspid Valve: The tricuspid valve is normal in structure. Tricuspid valve regurgitation is trivial. No evidence of tricuspid stenosis.  Aortic Valve: The aortic valve is grossly normal. Aortic valve regurgitation is not visualized. No aortic stenosis is present. Aortic valve mean gradient measures 3.0 mmHg. Aortic valve peak gradient measures 5.5 mmHg. Aortic valve area, by VTI measures 2.79 cm.  Pulmonic Valve: The pulmonic valve was not well visualized. Pulmonic valve regurgitation is not visualized. No evidence of pulmonic stenosis.  Aorta: The  aortic root, ascending aorta, aortic arch and descending aorta are all structurally normal, with no evidence of dilitation or obstruction.  Venous: The inferior vena cava is normal in size with greater than 50% respiratory variability, suggesting right atrial pressure of 3 mmHg.  IAS/Shunts: The atrial septum is grossly normal.   LEFT VENTRICLE PLAX 2D LVIDd:         5.00 cm      Diastology LVIDs:         3.60 cm      LV e' medial:    11.00 cm/s LV PW:         1.00 cm      LV E/e' medial:  5.0 LV IVS:        0.80 cm      LV e' lateral:   13.40 cm/s LVOT diam:     2.20 cm      LV E/e' lateral: 4.1 LV SV:         65 LV SV Index:   37 LVOT Area:     3.80 cm  LV Volumes (MOD) LV vol d, MOD A2C: 103.0 ml LV vol d, MOD A4C: 116.0 ml LV vol s, MOD A2C: 36.8 ml LV vol s, MOD A4C: 46.1 ml LV SV MOD A2C:  66.2 ml LV SV MOD A4C:     116.0 ml LV SV MOD BP:      68.6 ml  RIGHT VENTRICLE             IVC RV Basal diam:  3.00 cm     IVC diam: 1.70 cm RV S prime:     12.70 cm/s TAPSE (M-mode): 2.4 cm  LEFT ATRIUM             Index LA diam:        2.70 cm 1.51 cm/m LA Vol (A2C):   39.2 ml 21.97 ml/m LA Vol (A4C):   35.4 ml 19.84 ml/m LA Biplane Vol: 37.6 ml 21.07 ml/m AORTIC VALVE AV Area (Vmax):    3.10 cm AV Area (Vmean):   3.00 cm AV Area (VTI):     2.79 cm AV Vmax:           117.00 cm/s AV Vmean:          76.600 cm/s AV VTI:            0.234 m AV Peak Grad:      5.5 mmHg AV Mean Grad:      3.0 mmHg LVOT Vmax:         95.50 cm/s LVOT Vmean:        60.400 cm/s LVOT VTI:          0.172 m LVOT/AV VTI ratio: 0.74  AORTA Ao Root diam: 3.10 cm Ao Asc diam:  3.60 cm  MITRAL VALVE MV Area (PHT): 5.13 cm    SHUNTS MV Decel Time: 148 msec    Systemic VTI:  0.17 m MV E velocity: 55.30 cm/s  Systemic Diam: 2.20 cm MV A velocity: 47.10 cm/s MV E/A ratio:  1.17  Zachary Bruckner MD Electronically signed by Zachary Bruckner MD Signature Date/Time:  05/26/2024/12:43:43 PM    Final    MONITORS  CARDIAC EVENT MONITOR 07/04/2024  Narrative   PAC burden 12%  Predominant rhythm is sinus rhythm. Range is 48-166 bpm with average of 78 bpm. No atrial fibrillation, sustained ventricular tachycardia, significant pause, or high degree AV block.  PAC burden 12%.  1 patient triggered event, corresponding to sinus rhythm.       ______________________________________________________________________________________________      Risk Assessment/Calculations           Physical Exam VS:  BP 114/72   Pulse (!) 56   Ht 5' 9.5 (1.765 m)   Wt 142 lb 3.2 oz (64.5 kg)   SpO2 98%   BMI 20.70 kg/m        Wt Readings from Last 3 Encounters:  08/02/24 142 lb 3.2 oz (64.5 kg)  06/28/24 142 lb (64.4 kg)  05/25/24 139 lb 15.9 oz (63.5 kg)    GEN: Well nourished, well developed in no acute distress. Sitting comfortably on the exam table  NECK: No JVD; No carotid bruits CARDIAC:  RRR, no murmurs, rubs, gallops. Radial pulses 2+ bilaterally.  RESPIRATORY:  Clear to auscultation without rales, wheezing or rhonchi. Normal WOB on room air   ABDOMEN: Soft, non-tender, non-distended EXTREMITIES:  No edema in BLE; No deformity   ASSESSMENT AND PLAN  PACs - Noted to have 12% PAC burden on cardiac monitor in 06/2024. No atrial fibrillation noted  - Patient does have occasional palpitations.  Feels a skipped beat in his chest.  He has been having these for years.  Seem to worsen with emotional stress. - EKG today showed  sinus bradycardia with sinus arrhythmia, heart rate 56 bpm.  Rhythm strip with sinus rhythm.  No PACs noted - Patient has been cutting back on his caffeine and tobacco use.  Encouraged him to remain hydrated - Considered starting beta-blocker.  However, patient's resting heart rate 56 bpm today.  Through joint decision-making, we decided to monitor symptoms for now and consider medication in the future if heart rate can tolerate -  Echocardiogram 05/26/2024 with EF 55-60%, normal atria, no significant valvular abnormalities - K3.6, hemoglobin 13.9 in 05/2024.  Ordered TSH, free T4 today  Left common femoral artery occlusion - S/p left leg thrombectomy with common femoral artery endarterectomy and bovine patch angioplasty with vascular surgery - Denies claudication symptoms  - Followed by vascular surgery - Continue Eliquis  for total of 3 months postoperatively.  Then, stop Eliquis  and remain on aspirin  81 mg daily. Due to stop eliquis  08/25/24.   HLD  - Lipid panel 05/2024 with LDL 150 -Continue Lipitor  80 mg daily - Ordered repeat lipids, LFTs today   Dispo: Follow up in 3 months with Dr. Elmira   Signed, Rollo FABIENE Louder, PA-C

## 2024-08-02 ENCOUNTER — Ambulatory Visit: Attending: Cardiology | Admitting: Cardiology

## 2024-08-02 ENCOUNTER — Encounter: Payer: Self-pay | Admitting: Cardiology

## 2024-08-02 VITALS — BP 114/72 | HR 56 | Ht 69.5 in | Wt 142.2 lb

## 2024-08-02 DIAGNOSIS — E7800 Pure hypercholesterolemia, unspecified: Secondary | ICD-10-CM | POA: Diagnosis not present

## 2024-08-02 DIAGNOSIS — I491 Atrial premature depolarization: Secondary | ICD-10-CM | POA: Diagnosis not present

## 2024-08-02 DIAGNOSIS — I70202 Unspecified atherosclerosis of native arteries of extremities, left leg: Secondary | ICD-10-CM | POA: Insufficient documentation

## 2024-08-02 DIAGNOSIS — Z79899 Other long term (current) drug therapy: Secondary | ICD-10-CM | POA: Diagnosis not present

## 2024-08-02 MED ORDER — ATORVASTATIN CALCIUM 80 MG PO TABS: 80.0000 mg | ORAL_TABLET | Freq: Every day | ORAL | 3 refills | Status: DC

## 2024-08-02 NOTE — Patient Instructions (Addendum)
 Medication Instructions:  No changes *If you need a refill on your cardiac medications before your next appointment, please call your pharmacy*  Lab Work: Today we are going to draw  lipid panel and Lft's, TSH, and Free t4 If you have labs (blood work) drawn today and your tests are completely normal, you will receive your results only by: MyChart Message (if you have MyChart) OR A paper copy in the mail If you have any lab test that is abnormal or we need to change your treatment, we will call you to review the results.  Testing/Procedures: No testing  Follow-Up: At [facility], you and your health needs are our priority.  As part of our continuing mission to provide you with exceptional heart care, our providers are all part of one team.  This team includes your primary Cardiologist (physician) and Advanced Practice Providers or APPs (Physician Assistants and Nurse Practitioners) who all work together to provide you with the care you need, when you need it.  Your next appointment:   3 month(s)  Provider:   Newman JINNY Lawrence, MD    We recommend signing up for the patient portal called MyChart.  Sign up information is provided on this After Visit Summary.  MyChart is used to connect with patients for Virtual Visits (Telemedicine).  Patients are able to view lab/test results, encounter notes, upcoming appointments, etc.  Non-urgent messages can be sent to your provider as well.   To learn more about what you can do with MyChart, go to [email or web address].

## 2024-08-03 ENCOUNTER — Ambulatory Visit: Payer: Self-pay | Admitting: Cardiology

## 2024-08-03 LAB — LIPID PANEL
Chol/HDL Ratio: 3.1 ratio (ref 0.0–5.0)
Cholesterol, Total: 119 mg/dL (ref 100–199)
HDL: 38 mg/dL — ABNORMAL LOW (ref >39)
LDL Chol Calc (NIH): 63 mg/dL (ref 0–99)
Triglycerides: 91 mg/dL (ref 0–149)
VLDL Cholesterol Cal: 18 mg/dL (ref 5–40)

## 2024-08-03 LAB — HEPATIC FUNCTION PANEL
ALT: 21 IU/L (ref 0–44)
AST: 19 IU/L (ref 0–40)
Albumin: 4.8 g/dL (ref 4.1–5.1)
Alkaline Phosphatase: 124 IU/L — ABNORMAL HIGH (ref 44–121)
Bilirubin Total: 0.5 mg/dL (ref 0.0–1.2)
Bilirubin, Direct: 0.17 mg/dL (ref 0.00–0.40)
Total Protein: 6.9 g/dL (ref 6.0–8.5)

## 2024-08-03 LAB — TSH: TSH: 0.443 u[IU]/mL — ABNORMAL LOW (ref 0.450–4.500)

## 2024-08-03 LAB — T4, FREE: Free T4: 1.22 ng/dL (ref 0.82–1.77)

## 2024-08-04 ENCOUNTER — Ambulatory Visit: Admitting: Cardiology

## 2024-08-08 ENCOUNTER — Ambulatory Visit: Payer: Self-pay | Admitting: Nurse Practitioner

## 2024-08-08 ENCOUNTER — Encounter: Payer: Self-pay | Admitting: Nurse Practitioner

## 2024-08-08 ENCOUNTER — Ambulatory Visit: Payer: No Typology Code available for payment source | Admitting: Nurse Practitioner

## 2024-08-08 VITALS — BP 156/75 | HR 53 | Temp 97.9°F | Ht 69.5 in | Wt 142.0 lb

## 2024-08-08 DIAGNOSIS — N529 Male erectile dysfunction, unspecified: Secondary | ICD-10-CM | POA: Insufficient documentation

## 2024-08-08 DIAGNOSIS — R7303 Prediabetes: Secondary | ICD-10-CM | POA: Diagnosis not present

## 2024-08-08 DIAGNOSIS — E785 Hyperlipidemia, unspecified: Secondary | ICD-10-CM | POA: Diagnosis not present

## 2024-08-08 DIAGNOSIS — K2100 Gastro-esophageal reflux disease with esophagitis, without bleeding: Secondary | ICD-10-CM | POA: Insufficient documentation

## 2024-08-08 DIAGNOSIS — Z0001 Encounter for general adult medical examination with abnormal findings: Secondary | ICD-10-CM | POA: Diagnosis not present

## 2024-08-08 DIAGNOSIS — R7989 Other specified abnormal findings of blood chemistry: Secondary | ICD-10-CM

## 2024-08-08 LAB — BAYER DCA HB A1C WAIVED: HB A1C (BAYER DCA - WAIVED): 5.3 % (ref 4.8–5.6)

## 2024-08-08 MED ORDER — TADALAFIL 5 MG PO TABS: 5.0000 mg | ORAL_TABLET | Freq: Every day | ORAL | 0 refills | Status: DC

## 2024-08-08 MED ORDER — BUSPIRONE HCL 7.5 MG PO TABS: 7.5000 mg | ORAL_TABLET | Freq: Two times a day (BID) | ORAL | 0 refills | Status: DC

## 2024-08-08 MED ORDER — ATORVASTATIN CALCIUM 80 MG PO TABS: 80.0000 mg | ORAL_TABLET | Freq: Every day | ORAL | 1 refills | Status: DC

## 2024-08-08 MED ORDER — OMEPRAZOLE 20 MG PO CPDR: 20.0000 mg | DELAYED_RELEASE_CAPSULE | Freq: Every day | ORAL | 0 refills | Status: DC

## 2024-08-08 NOTE — Progress Notes (Signed)
 New Patient Office Visit  Subjective   Patient ID: Zachary Rodriguez, male    DOB: Feb 25, 1976  Age: 48 y.o. MRN: [id number]  CC:  Chief Complaint  Patient presents with   Establish Care    HPI Zachary Rodriguez. Weigelt is a 48 year old male presenting on 08/08/2024 to establish care. He provided verbal approval for his spouse, Zachary Rodriguez, to remain in the room during the visit. The patient was admitted on 05/23/2024 for peripheral vascular disease (PVD) and underwent surgery on 05/24/2024. He was prescribed Eliquis  for 3 months and reports having enough supply to complete the course; he will transition to aspirin  thereafter. Today, he expresses concern regarding erectile dysfunction, describing difficulty maintaining an erection, and is agreeable to starting medication for management. Recent laboratory results showed abnormal A1c and TSH levels. The plan is to repeat these labs, and if TSH remains abnormal, he will be referred to endocrinology. Needs BuSpar  and Lipitor  refilled Smokes 2 pack, started at 48 yrs old   Outpatient Encounter Medications as of 08/08/2024  Medication Sig   apixaban  (ELIQUIS ) 5 MG TABS tablet Take 1 tablet (5 mg total) by mouth 2 (two) times daily.   aspirin  EC 81 MG tablet Take 1 tablet (81 mg total) by mouth daily. Swallow whole.   docusate sodium  (COLACE) 100 MG capsule Take 100 mg by mouth 2 (two) times daily.   omeprazole  (PRILOSEC) 20 MG capsule Take 1 capsule (20 mg total) by mouth daily.   tadalafil  (CIALIS ) 5 MG tablet Take 1 tablet (5 mg total) by mouth daily.   [DISCONTINUED] atorvastatin  (LIPITOR ) 80 MG tablet Take 1 tablet (80 mg total) by mouth daily.   [DISCONTINUED] busPIRone  (BUSPAR ) 7.5 MG tablet Take 1 tablet (7.5 mg total) by mouth 2 (two) times daily.   atorvastatin  (LIPITOR ) 80 MG tablet Take 1 tablet (80 mg total) by mouth daily.   busPIRone  (BUSPAR ) 7.5 MG tablet Take 1 tablet (7.5 mg total) by mouth 2 (two) times daily.   No facility-administered encounter  medications on file as of 08/08/2024.    Past Medical History:  Diagnosis Date   Abdominal hernia    Elevated LDL cholesterol level 03/20/2020   Hyperlipidemia 05/25/24    Past Surgical History:  Procedure Laterality Date   ENDARTERECTOMY FEMORAL Left 05/25/2024   Procedure: LEFT COMMON FEMORAL ENDARTERECTOMY WITH BOVINE PATCH;  Surgeon: Gretta Lonni PARAS, MD;  Location: [facility] OR;  Service: Vascular;  Laterality: Left;   HERNIA REPAIR     MOUTH SURGERY     THROMBECTOMY FEMORAL ARTERY Left 05/25/2024   Procedure: LEFT LOWER EXTRIMITY THROMBECTOMY, ARTERY, FEMORAL;  Surgeon: Gretta Lonni PARAS, MD;  Location: [facility] OR;  Service: Vascular;  Laterality: Left;    Family History  Problem Relation Age of Onset   COPD Paternal Grandfather    Emphysema Paternal Grandfather     Social History   Socioeconomic History   Marital status: Married    Spouse name: Not on file   Number of children: Not on file   Years of education: Not on file   Highest education level: Not on file  Occupational History   Not on file  Tobacco Use   Smoking status: Former    Average packs/day: 1.5 packs/day for 28.4 years (42.7 ttl pk-yrs)    Types: Cigarettes    Start date: 1997   Smokeless tobacco: Never  Vaping Use   Vaping status: Never Used  Substance and Sexual Activity   Alcohol use: No   Drug use:  Not Currently    Types: Marijuana    Comment: 03/19/20- states no drugs    Sexual activity: Not on file  Other Topics Concern   Not on file  Social History Narrative   Not on file   Social Drivers of Health   Financial Resource Strain: Not on file  Food Insecurity: No Food Insecurity (05/24/2024)   Hunger Vital Sign    Worried About Running Out of Food in the Last Year: Never true    Ran Out of Food in the Last Year: Never true  Transportation Needs: No Transportation Needs (05/24/2024)   PRAPARE - Administrator, Civil Service (Medical): No    Lack of Transportation (Non-Medical): No   Physical Activity: Not on file  Stress: Not on file  Social Connections: Not on file  Intimate Partner Violence: Not At Risk (05/24/2024)   Humiliation, Afraid, Rape, and Kick questionnaire    Fear of Current or Ex-Partner: No    Emotionally Abused: No    Physically Abused: No    Sexually Abused: No    Review of Systems  Constitutional:  Negative for chills and fever.  Respiratory:  Negative for cough, shortness of breath and wheezing.   Cardiovascular:  Negative for chest pain and leg swelling.  Skin:  Negative for itching and rash.  Neurological:  Negative for dizziness and headaches.   Negative unless indicated in HPI    Objective   BP (!) 156/75   Pulse (!) 53   Temp 97.9 F (36.6 C) (Temporal)   Ht 5' 9.5 (1.765 m)   Wt 142 lb (64.4 kg)   SpO2 97%   BMI 20.67 kg/m   Physical Exam  Last CBC Lab Results  Component Value Date   WBC 12.0 (H) 05/26/2024   HGB 13.9 05/26/2024   HCT 40.6 05/26/2024   MCV 82.2 05/26/2024   MCH 28.1 05/26/2024   RDW 14.6 05/26/2024   PLT 229 05/26/2024   Last metabolic panel Lab Results  Component Value Date   GLUCOSE 120 (H) 05/26/2024   NA 135 05/26/2024   K 3.6 05/26/2024   CL 104 05/26/2024   CO2 21 (L) 05/26/2024   BUN 13 05/26/2024   CREATININE 1.00 05/26/2024   GFRNONAA >60 05/26/2024   CALCIUM  9.1 05/26/2024   PROT 6.9 08/02/2024   ALBUMIN  4.8 08/02/2024   LABGLOB 2.0 03/19/2020   AGRATIO 2.5 (H) 03/19/2020   BILITOT 0.5 08/02/2024   ALKPHOS 124 (H) 08/02/2024   AST 19 08/02/2024   ALT 21 08/02/2024   ANIONGAP 10 05/26/2024   Last lipids Lab Results  Component Value Date   CHOL 119 08/02/2024   HDL 38 (L) 08/02/2024   LDLCALC 63 08/02/2024   TRIG 91 08/02/2024   CHOLHDL 3.1 08/02/2024   Last hemoglobin A1c Lab Results  Component Value Date   HGBA1C 5.7 (H) 05/24/2024   Last thyroid  functions Lab Results  Component Value Date   TSH 0.443 (L) 08/02/2024        Assessment & Plan:  Encounter  for general adult medical examination with abnormal findings -     Thyroid  Panel With TSH -     Bayer DCA Hb A1c Waived -     Tadalafil ; Take 1 tablet (5 mg total) by mouth daily.  Dispense: 30 tablet; Refill: 0 -     Omeprazole ; Take 1 capsule (20 mg total) by mouth daily.  Dispense: 90 capsule; Refill: 0  Hyperlipidemia, unspecified hyperlipidemia type  Prediabetes -     Bayer DCA Hb A1c Waived  Erectile dysfunction, unspecified erectile dysfunction type -     Tadalafil ; Take 1 tablet (5 mg total) by mouth daily.  Dispense: 30 tablet; Refill: 0  Gastroesophageal reflux disease with esophagitis without hemorrhage -     Omeprazole ; Take 1 capsule (20 mg total) by mouth daily.  Dispense: 90 capsule; Refill: 0  Abnormal TSH -     Thyroid  Panel With TSH  Other orders -     busPIRone  HCl; Take 1 tablet (7.5 mg total) by mouth 2 (two) times daily.  Dispense: 180 tablet; Refill: 0 -     Atorvastatin  Calcium ; Take 1 tablet (80 mg total) by mouth daily.  Dispense: 90 tablet; Refill: 1  Zachary Rodriguez is a 48 yrs old Caucasian male sen today to establish care, no acute distress  Assessment & Plan:  Peripheral Vascular Disease (PVD):  Status post-surgery on 05/24/2024.  Continue Eliquis  as prescribed until course completion (3 months total).  Transition to aspirin  therapy thereafter.  Continue smoking cessation, encourage heart-healthy diet, and regular walking program as tolerated.  Erectile Dysfunction: Patient reports difficulty maintaining an erection. Will initiate trial of PDE5 inhibitor; Cialis  5 mg daily  Educated on potential side effects (headache, flushing, vision changes, priapism)..  Abnormal Laboratory Results:  Prior labs showed elevated A1c and abnormal TSH.  Repeat A1c and TSH with reflex free T4.  If thyroid  function remains abnormal, refer to endocrinology.  Monitor glucose and discuss lifestyle modification (low-carb diet, exercise).  Labs: A1c, TSH results  pending  Hyperlipidemia: continue Lipitor  80 mg, refill provided Continue Buspar  7.5 mg BID, refill prodied Plan to continue all previous prescribed medications     The above assessment and management plan was discussed with the patient. The patient verbalized understanding of and has agreed to the management plan. Patient is aware to call the clinic if they develop any new symptoms or if symptoms persist or worsen. Patient is aware when to return to the clinic for a follow-up visit. Patient educated on when it is appropriate to go to the emergency department.  Return in about 1 month (around 09/08/2024) for chronic Diseases managemnt.   Zachary Basques [place] Thompson, DNP [facility] [address] [place], [place] [address] [phone]  Note: This document was prepared by [employer] voice dictation technology and any errors that results from this process are unintentional.

## 2024-08-09 LAB — THYROID PANEL WITH TSH
Free Thyroxine Index: 1.4 (ref 1.2–4.9)
T3 Uptake Ratio: 26 % (ref 24–39)
T4, Total: 5.3 ug/dL (ref 4.5–12.0)
TSH: 1.36 u[IU]/mL (ref 0.450–4.500)

## 2024-08-12 ENCOUNTER — Other Ambulatory Visit (HOSPITAL_COMMUNITY): Payer: Self-pay

## 2024-08-16 ENCOUNTER — Other Ambulatory Visit: Payer: Self-pay | Admitting: Nurse Practitioner

## 2024-08-16 DIAGNOSIS — N529 Male erectile dysfunction, unspecified: Secondary | ICD-10-CM

## 2024-08-16 MED ORDER — TADALAFIL (PAH) 20 MG PO TABS: 20.0000 mg | ORAL_TABLET | Freq: Every day | ORAL | 0 refills | Status: DC

## 2024-08-17 ENCOUNTER — Other Ambulatory Visit (HOSPITAL_COMMUNITY): Payer: Self-pay

## 2024-08-26 ENCOUNTER — Encounter: Payer: Self-pay | Admitting: *Deleted

## 2024-09-08 NOTE — Progress Notes (Addendum)
 Subjective:  Patient ID: Zachary Rodriguez, male    DOB: 05/11/1976, 48 y.o.   MRN: [id number]  Patient Care Team: Deitra Morton Hummer, Nena, NP as PCP - General (Nurse Practitioner) Elmira Newman PARAS, MD as PCP - Cardiology (Cardiology)   Chief Complaint:  Medical Management of Chronic Issues (1 month )   HPI: Zachary Rodriguez is a 48 y.o. male presenting with his wife on 09/12/2024 for Medical Management of Chronic Issues (1 month )   Discussed the use of AI scribe software for clinical note transcription with the patient, who gave verbal consent to proceed.  History of Present Illness Zachary Rodriguez is a 48 year old male who presents with gastrointestinal symptoms and concerns about tick bites.  He experienced a gastrointestinal illness last week, lasting three to four days, characterized by nausea and vomiting. No fever was present during this period. Nausea has been persistent since his hernia surgery, occurring daily, with a recent increase in severity .He also takes a stool softener every other day to manage bowel movements.  Two weeks ago, he had four tick bites, including one in his umbilicus that went unnoticed for about a day. He is concerned that his current symptoms might be related to these tick bites. 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  He experiences persistent headaches that do not resolve. He takes aspirin  for pain relief but cannot take ibuprofen  due to its effects on his stomach. Acetaminophen  is ineffective for his pain and inflammation.  He reports back pain that began after hearing a 'big ass pop' when getting out of bed, persisting for about three weeks. The pain is located in the middle back but does not radiate to his legs, and there is no loss of bowel or bladder function. He has been sleeping on the floor to alleviate the pain, as  lying in bed exacerbates it.  He is currently taking Buspar  for anxiety but reports that it is not providing relief, stating 'I go right through that.' He has been taking it twice a day as prescribed. Reports increase anxiety despite taking the medication, not sleeping well.  I am worry about everything that is going on in our society. He is agreable to trying Remeron and having Buspar  d/c      Relevant past medical, surgical, family, and social history reviewed and updated as indicated.  Allergies and medications reviewed and updated. Data reviewed: Chart in Epic.   Past Medical History:  Diagnosis Date   Abdominal hernia    Elevated LDL cholesterol level 03/20/2020   Hyperlipidemia 05/25/24    Past Surgical History:  Procedure Laterality Date   ENDARTERECTOMY FEMORAL Left 05/25/2024   Procedure: LEFT COMMON FEMORAL ENDARTERECTOMY WITH BOVINE PATCH;  Surgeon: Gretta Lonni PARAS, MD;  Location: [facility] OR;  Service: Vascular;  Laterality: Left;   HERNIA REPAIR     MOUTH SURGERY     THROMBECTOMY FEMORAL ARTERY Left 05/25/2024   Procedure: LEFT LOWER EXTRIMITY THROMBECTOMY, ARTERY, FEMORAL;  Surgeon: Gretta Lonni PARAS, MD;  Location: [facility] OR;  Service: Vascular;  Laterality: Left;    Social History   Socioeconomic History   Marital status: Married    Spouse name: Not on file   Number of children: Not on file   Years of education: Not on file   Highest education level: 12th grade  Occupational History   Not on file  Tobacco Use   Smoking status: Former    Average packs/day: 1.5 packs/day for 28.4 years (42.7 ttl pk-yrs)    Types: Cigarettes    Start date: 1997   Smokeless tobacco: Never  Vaping Use   Vaping status: Never Used  Substance and Sexual Activity   Alcohol use: No   Drug use: Not Currently    Types: Marijuana    Comment: 03/19/20- states no drugs    Sexual activity: Not on file  Other Topics Concern   Not on file  Social History Narrative   Not on file    Social Drivers of Health   Financial Resource Strain: Medium Risk (09/11/2024)   Overall Financial Resource Strain (CARDIA)    Difficulty of Paying Living Expenses: Somewhat hard  Food Insecurity: No Food Insecurity (09/11/2024)   Hunger Vital Sign    Worried About Running Out of Food in the Last Year: Never true    Ran Out of Food in the Last Year: Never true  Transportation Needs: No Transportation Needs (09/11/2024)   PRAPARE - Administrator, Civil Service (Medical): No    Lack of Transportation (Non-Medical): No  Physical Activity: Not on file  Stress: Not on file  Social Connections: Moderately Isolated (09/11/2024)   Social Connection and Isolation Panel    Frequency of Communication with Friends and Family: Twice a week    Frequency of Social Gatherings with Friends and Family: Once a week    Attends Religious Services: Never    Database administrator or Organizations: No    Attends Engineer, structural: Not on file    Marital Status: Married  Catering manager Violence: Not At Risk (05/24/2024)   Humiliation, Afraid, Rape, and Kick questionnaire    Fear of Current or Ex-Partner: No    Emotionally Abused: No    Physically Abused: No    Sexually Abused: No    Outpatient Encounter Medications as of 09/12/2024  Medication Sig   aspirin  EC 81 MG tablet Take 1 tablet (81 mg total) by mouth daily. Swallow whole.   atorvastatin  (LIPITOR ) 80 MG tablet Take 1 tablet (80 mg total) by mouth daily.   busPIRone  (BUSPAR ) 7.5 MG tablet Take 1 tablet (7.5 mg total) by mouth 2 (two) times daily.   docusate sodium  (COLACE) 100 MG capsule Take 100 mg by mouth 2 (two) times daily.   meloxicam  (MOBIC ) 7.5 MG tablet Take 1 tablet (7.5 mg total) by mouth daily.   mirtazapine (REMERON SOL-TAB) 15 MG disintegrating tablet Take 1 tablet (15 mg total) by mouth at bedtime.   omeprazole  (PRILOSEC) 20 MG capsule Take 1 capsule (20 mg total) by mouth daily.   ondansetron  (ZOFRAN ) 4  MG tablet Take 1 tablet (4 mg total) by mouth every 8 (eight) hours as needed for nausea or vomiting.   tadalafil , PAH, (ADCIRCA ) 20 MG tablet Take 1 tablet (20 mg total) by mouth daily.   [DISCONTINUED] apixaban  (ELIQUIS ) 5 MG TABS tablet Take 1 tablet (5 mg total) by mouth 2 (two) times daily.   No facility-administered encounter medications on file as of 09/12/2024.    No Known Allergies  Pertinent ROS per HPI, otherwise unremarkable      Objective:  BP 105/60   Pulse 69   Temp (!) 97.4 F (36.3 C) (Temporal)   Ht 5' 9.5 (1.765 m)   Wt 140 lb 12.8 oz (63.9 kg)   SpO2 98%  BMI 20.49 kg/m    Wt Readings from Last 3 Encounters:  09/12/24 140 lb 12.8 oz (63.9 kg)  08/08/24 142 lb (64.4 kg)  08/02/24 142 lb 3.2 oz (64.5 kg)    Physical Exam Vitals and nursing note reviewed.  Constitutional:      General: He is not in acute distress. HENT:     Head: Normocephalic and atraumatic.     Nose: Nose normal.     Mouth/Throat:     Mouth: Mucous membranes are moist.  Eyes:     General: No scleral icterus.    Extraocular Movements: Extraocular movements intact.     Conjunctiva/sclera: Conjunctivae normal.     Pupils: Pupils are equal, round, and reactive to light.  Cardiovascular:     Heart sounds: Normal heart sounds.  Pulmonary:     Effort: Pulmonary effort is normal.     Breath sounds: Normal breath sounds.  Abdominal:     Palpations: Abdomen is soft.  Musculoskeletal:        General: Normal range of motion.     Right lower leg: No edema.     Left lower leg: No edema.  Skin:    General: Skin is warm and dry.     Findings: No rash.  Neurological:     Mental Status: He is alert and oriented to person, place, and time.  Psychiatric:        Attention and Perception: Attention and perception normal.        Mood and Affect: Affect normal. Mood is anxious.        Speech: Speech normal.        Behavior: Behavior normal.        Thought Content: Thought content normal.  Thought content does not include homicidal or suicidal ideation. Thought content does not include homicidal or suicidal plan.        Cognition and Memory: Cognition and memory normal.        Judgment: Judgment normal.    Physical Exam      Results for orders placed or performed in visit on 08/08/24  Bayer DCA Hb A1c Waived   Collection Time: 08/08/24  1:32 PM  Result Value Ref Range   HB A1C (BAYER DCA - WAIVED) 5.3 4.8 - 5.6 %  Thyroid  Panel With TSH   Collection Time: 08/08/24  1:36 PM  Result Value Ref Range   TSH 1.360 0.450 - 4.500 uIU/mL   T4, Total 5.3 4.5 - 12.0 ug/dL   T3 Uptake Ratio 26 24 - 39 %   Free Thyroxine Index 1.4 1.2 - 4.9       Pertinent labs & imaging results that were available during my care of the patient were reviewed by me and considered in my medical decision making.  Assessment & Plan:  Shawn was seen today for medical management of chronic issues.  Diagnoses and all orders for this visit:  Depression, recurrent  Erectile dysfunction, unspecified erectile dysfunction type  Gastroesophageal reflux disease with esophagitis without hemorrhage  Generalized anxiety disorder  Hyperlipidemia, unspecified hyperlipidemia type  Tick bite of pelvic region, initial encounter -     Alpha-Gal Panel -     Lyme Disease Serology w/Reflex  Acute midline low back pain without sciatica -     meloxicam  (MOBIC ) 7.5 MG tablet; Take 1 tablet (7.5 mg total) by mouth daily.  Nausea  Other orders -     mirtazapine (REMERON SOL-TAB) 15 MG disintegrating tablet; Take 1 tablet (15  mg total) by mouth at bedtime. -     ondansetron  (ZOFRAN ) 4 MG tablet; Take 1 tablet (4 mg total) by mouth every 8 (eight) hours as needed for nausea or vomiting.     Assessment and Plan Assessment & Plan Recent tick bites, rule out tick-borne illness Recent tick bites with symptoms of persistent headaches and gastrointestinal upset. Differential includes Lyme disease and alpha-gal  syndrome. [facility] spotted fever unlikely due to location. - Order Lyme disease and alpha-gal syndrome tests.  Chronic headaches Persistent headaches with ineffective Tylenol  use. Ibuprofen  contraindicated due to gastrointestinal issues.  Chronic nausea Chronic nausea post-hernia surgery, worsened recently. Phenergan  causes sedation. - Prescribe Zofran  for nausea.  Chronic low back pain Chronic low back pain with recent exacerbation. Pain localized to middle back, no radiation or bowel/bladder issues. Aspirin  inadequate, NSAIDs contraindicated. - Prescribe Meloxicam  for inflammation.  Generalized anxiety disorder and depression Inadequate response to Buspirone . Discussed trial and error of psychiatric medications and potential genetic testing. Remeron initiated for anxiety and sleep. - Discontinue Buspirone . - Prescribe Remeron 15 mg at bedtime. - Schedule follow-up in 6 weeks to assess response.  Erectile dysfunction Erectile dysfunction managed with Cialis . Recent dosage adjustment for insurance approval. - Ensure Cialis  prescription is picked up. - Consider alternative medications like Viagra if insurance issues persist.      Continue all other maintenance medications.  Follow up plan: Return in about 6 weeks (around 10/24/2024) for mental health med titration.   Continue healthy lifestyle choices, including diet (rich in fruits, vegetables, and lean proteins, and low in salt and simple carbohydrates) and exercise (at least 30 minutes of moderate physical activity daily).  Educational handout given for  Acute Back Pain, Adult Acute back pain is sudden and usually short-lived. It is often caused by an injury to the muscles and tissues in the back. The injury may result from: A muscle, tendon, or ligament getting overstretched or torn. Ligaments are tissues that connect bones to each other. Lifting something improperly can cause a back strain. Wear and tear  (degeneration) of the spinal disks. Spinal disks are circular tissue that provide cushioning between the bones of the spine (vertebrae). Twisting motions, such as while playing sports or doing yard work. A hit to the back. Arthritis. You may have a physical exam, lab tests, and imaging tests to find the cause of your pain. Acute back pain usually goes away with rest and home care. Follow these instructions at home: Managing pain, stiffness, and swelling Take over-the-counter and prescription medicines only as told by your health care provider. Treatment may include medicines for pain and inflammation that are taken by mouth or applied to the skin, or muscle relaxants. Your health care provider may recommend applying ice during the first 24-48 hours after your pain starts. To do this: Put ice in a plastic bag. Place a towel between your skin and the bag. Leave the ice on for 20 minutes, 2-3 times a day. Remove the ice if your skin turns bright red. This is very important. If you cannot feel pain, heat, or cold, you have a greater risk of damage to the area. If directed, apply heat to the affected area as often as told by your health care provider. Use the heat source that your health care provider recommends, such as a moist heat pack or a heating pad. Place a towel between your skin and the heat source. Leave the heat on for 20-30 minutes. Remove the heat if your skin  turns bright red. This is especially important if you are unable to feel pain, heat, or cold. You have a greater risk of getting burned. Activity  Do not stay in bed. Staying in bed for more than 1-2 days can delay your recovery. Sit up and stand up straight. Avoid leaning forward when you sit or hunching over when you stand. If you work at a desk, sit close to it so you do not need to lean over. Keep your chin tucked in. Keep your neck drawn back, and keep your elbows bent at a 90-degree angle (right angle). Sit high and close to  the steering wheel when you drive. Add lower back (lumbar) support to your car seat, if needed. Take short walks on even surfaces as soon as you are able. Try to increase the length of time you walk each day. Do not sit, drive, or stand in one place for more than 30 minutes at a time. Sitting or standing for long periods of time can put stress on your back. Do not drive or use heavy machinery while taking prescription pain medicine. Use proper lifting techniques. When you bend and lift, use positions that put less stress on your back: Clayton your knees. Keep the load close to your body. Avoid twisting. Exercise regularly as told by your health care provider. Exercising helps your back heal faster and helps prevent back injuries by keeping muscles strong and flexible. Work with a physical therapist to make a safe exercise program, as recommended by your health care provider. Do any exercises as told by your physical therapist. Lifestyle Maintain a healthy weight. Extra weight puts stress on your back and makes it difficult to have good posture. Avoid activities or situations that make you feel anxious or stressed. Stress and anxiety increase muscle tension and can make back pain worse. Learn ways to manage anxiety and stress, such as through exercise. General instructions Sleep on a firm mattress in a comfortable position. Try lying on your side with your knees slightly bent. If you lie on your back, put a pillow under your knees. Keep your head and neck in a straight line with your spine (neutral position) when using electronic equipment like smartphones or pads. To do this: Raise your smartphone or pad to look at it instead of bending your head or neck to look down. Put the smartphone or pad at the level of your face while looking at the screen. Follow your treatment plan as told by your health care provider. This may include: Cognitive or behavioral therapy. Acupuncture or massage  therapy. Meditation or yoga. Contact a health care provider if: You have pain that is not relieved with rest or medicine. You have increasing pain going down into your legs or buttocks. Your pain does not improve after 2 weeks. You have pain at night. You lose weight without trying. You have a fever or chills. You develop nausea or vomiting. You develop abdominal pain. Get help right away if: You develop new bowel or bladder control problems. You have unusual weakness or numbness in your arms or legs. You feel faint. These symptoms may represent a serious problem that is an emergency. Do not wait to see if the symptoms will go away. Get medical help right away. Call your local emergency services (911 in the [place] Do not drive yourself to the hospital. Summary Acute back pain is sudden and usually short-lived. Use proper lifting techniques. When you bend and lift, use positions that put less  stress on your back. Take over-the-counter and prescription medicines only as told by your health care provider, and apply heat or ice as told. This information is not intended to replace advice given to you by your health care provider. Make sure you discuss any questions you have with your health care provider. Document Revised: 02/22/2021 Document Reviewed: 02/22/2021 Elsevier Patient Education  2024 Else[employer]. Depression with the Seasons (Seasonal Affective Disorder): What to Know Seasonal Affective Disorder (SAD) is a kind of depression. It makes you feel depressed or sad at certain times of the year. Even though SAD can sometimes happen in the spring or summer, it usually happens in late fall and winter when the days are shorter and people spend less time outside. This is why it's also called the winter blues. SAD can be mild or severe and it can make it hard to work, go to school, have good relationships, and do daily activities. What are the causes? The cause of SAD is not known. It  might be because of changes in brain chemicals when there is more or less daylight. What increases the risk? You are more likely to develop SAD if: You are male. You live far away from the equator, where there is less sunlight and longer winters. You have depression or bipolar disorder. Someone in your family has or has had mental health problems. What are the signs or symptoms? Symptoms of SAD include: Mood changes, such as: Feeling sad or crying a lot. Getting angry easier or faster than usual. Feeling guilty or worthless. Thinking about hurting yourself or attempting suicide. Physical changes, such as: Feeling restless or very tired. Having trouble concentrating or making decisions. Having a big change in appetite or weight. Behavioral changes, such as: Having trouble sleeping or sleeping too much. Losing interest in things you usually enjoy. Eating too much or craving sweet foods. Avoiding people and wanting to be alone. Symptoms of summer SAD include: Not feeling hungry. Losing weight. Having trouble sleeping. Being very aggressive or violent, in severe cases. How is this diagnosed? A health care provider or mental health specialist may ask about your feelings, thoughts, and actions, and if you notice a pattern with the seasons. They may also ask about: Your medical history. If you have had any big changes in your life. Any medicines you take. Any substances you use. You might get a physical exam and blood tests to make sure nothing else is causing your symptoms, like thyroid  problems, low blood sugar, or an infection. How is this treated? Usually, SAD symptoms get better on their own when the seasons change, but treatment can help with feeling better faster, especially if the symptoms are really bad. Examples of treatments and things you can do to help with SAD symptoms include: Having light therapy or taking steps to be exposed to more light (for fall or winter SAD),  like: Using a light box designed specifically to treat SAD. Using a dawn simulator or sunrise clock. This is a timer-activated light source that copies the sunrise by slowly becoming brighter. This can help to activate your body's internal clock. Making your home and workplace bright and sunny. Open the window blinds. Move your furniture closer to the windows to get more natural light. Trying to spend time outside if you can. But, remember to talk to your provider about the good and bad things about being in the sun. Doing cognitive behavioral therapy (CBT). CBT is a form of talk therapy that helps to  identify and change negative thoughts that are associated with SAD. Taking medicine, like an antidepressant. Doing things to stay healthy and feel good, such as: Exercise regularly, Eat healthy foods. Get enough sleep. Stay busy and connected with other people by helping others, joining group activities, and spending time with friends and family. Follow these instructions at home: Medicines Take your medicines only as told. If you take medicine for SAD, avoid using alcohol and other substances because they may prevent your medicine from working correctly. Talk with your pharmacist or provider about: All the medicines that you take. Possible side effects. What medicines are safe to take together,. Ask you provider about when you should expect for your symptoms to get better. Antidepressants take a while to start working. Find out which side effects or symptoms are so serious you should call the provider. Talk to your provider before you start taking any new prescription medicines, medicines you can buy at the store, herbs, or supplements. General instructions Get enough sleep. To improve your sleep, try: Keeping your bedroom dark and cool. Going to bed and waking up at the same time every day. Limiting your screen time starting a few hours before bedtime. Exercise regularly. Eat healthy  foods like fruits, vegetables, whole grains, and lean proteins. Keep all follow-up visits. Your provider will need to monitor your symptoms and treatment response. Where to find more information To learn more, go to these websites: American Psychological Association: TropicalCloud.ca American Psychiatric Association: psychiatry.[employer] of Mental Health: BloggerCourse.com Then: Click the search box. Type Seasonal Affective Disorder in the search box and find the link you need. Contact a health care provider if: Your symptoms don't get better or they get worse. You have trouble taking care of yourself. You are using alcohol or other substances to cope with your symptoms. You have side effects from medicines. Get help right away if: You feel like you may hurt yourself or others. You have thoughts about taking your own life. You have other thoughts or feelings that worry you. These symptoms may be an emergency. Take one of these steps right away: Go to your nearest emergency room. Call 911. Contact the 988 Suicide & Crisis Lifeline (24/7, free and confidential): Call or text 988. Chat online at chat.NewsActor.se. For Veterans and their loved ones: Call 988 and press 1. Text the [employer] at 938-657-9771. Chat online at ReservationsList.si. This information is not intended to replace advice given to you by your health care provider. Make sure you discuss any questions you have with your health care provider. Document Revised: 02/16/2024 Document Reviewed: 02/15/2024 Elsevier Patient Education  2025 [employer]. Erectile Dysfunction Erectile dysfunction (ED) is the inability to get or keep an erection in order to have sexual intercourse. ED is considered a symptom of an underlying disorder and is not considered a disease. ED may include: Inability to get an erection. Lack of enough hardness of the erection to allow penetration. Loss of erection before sex is  finished. What are the causes? This condition may be caused by: Physical causes, such as: Artery problems. This may include heart disease, high blood pressure, atherosclerosis, and diabetes. Hormonal problems, such as low testosterone. Obesity. Nerve problems. This may include back or pelvic injuries, multiple sclerosis, Parkinson's disease, spinal cord injury, and stroke. Certain medicines, such as: Pain relievers. Antidepressants. Blood pressure medicines and water pills (diuretics). Cancer medicines. Antihistamines. Muscle relaxants. Lifestyle factors, such as: Use of drugs such as marijuana, cocaine, or opioids. Excessive  use of alcohol. Smoking. Lack of physical activity or exercise. Psychological causes, such as: Anxiety or stress. Sadness or depression. Exhaustion. Fear about sexual performance. Guilt. What are the signs or symptoms? Symptoms of this condition include: Inability to get an erection. Lack of enough hardness of the erection to allow penetration. Loss of the erection before sex is finished. Sometimes having normal erections, but with frequent unsatisfactory episodes. Low sexual satisfaction in either partner due to erection problems. A curved penis occurring with erection. The curve may cause pain, or the penis may be too curved to allow for intercourse. Never having nighttime or morning erections. How is this diagnosed? This condition is often diagnosed by: Performing a physical exam to find other diseases or specific problems with the penis. Asking you detailed questions about the problem. Doing tests, such as: Blood tests to check for diabetes mellitus or high cholesterol, or to measure hormone levels. Other tests to check for underlying health conditions. An ultrasound exam to check for scarring. A test to check blood flow to the penis. Doing a sleep study at home to measure nighttime erections. How is this treated? This condition may be treated  by: Medicines, such as: Medicine taken by mouth to help you achieve an erection (oral medicine). Hormone replacement therapy to replace low testosterone levels. Medicine that is injected into the penis. Your health care provider may instruct you how to give yourself these injections at home. Medicine that is delivered with a short applicator tube. The tube is inserted into the opening at the tip of the penis, which is the opening of the urethra. A tiny pellet of medicine is put in the urethra. The pellet dissolves and enhances erectile function. This is also called MUSE (medicated urethral system for erections) therapy. Vacuum pump. This is a pump with a ring on it. The pump and ring are placed on the penis and used to create pressure that helps the penis become erect. Penile implant surgery. In this procedure, you may receive: An inflatable implant. This consists of cylinders, a pump, and a reservoir. The cylinders can be inflated with a fluid that helps to create an erection, and they can be deflated after intercourse. A semi-rigid implant. This consists of two silicone rubber rods. The rods provide some rigidity. They are also flexible, so the penis can both curve downward in its normal position and become straight for sexual intercourse. Blood vessel surgery to improve blood flow to the penis. During this procedure, a blood vessel from a different part of the body is placed into the penis to allow blood to flow around (bypass) damaged or blocked blood vessels. Lifestyle changes, such as exercising more, losing weight, and quitting smoking. Follow these instructions at home: Medicines  Take over-the-counter and prescription medicines only as told by your health care provider. Do not increase the dosage without first discussing it with your health care provider. If you are using self-injections, do injections as directed by your health care provider. Make sure you avoid any veins that are on the  surface of the penis. After giving an injection, apply pressure to the injection site for 5 minutes. Talk to your health care provider about how to prevent headaches while taking ED medicines. These medicines may cause a sudden headache due to the increase in blood flow in your body. General instructions Exercise regularly, as directed by your health care provider. Work with your health care provider to lose weight, if needed. Do not use any products that  contain nicotine  or tobacco. These products include cigarettes, chewing tobacco, and vaping devices, such as e-cigarettes. If you need help quitting, ask your health care provider. Before using a vacuum pump, read the instructions that come with the pump and discuss any questions with your health care provider. Keep all follow-up visits. This is important. Contact a health care provider if: You feel nauseous. You are vomiting. You get sudden headaches while taking ED medicines. You have any concerns about your sexual health. Get help right away if: You are taking oral or injectable medicines and you have an erection that lasts longer than 4 hours. If your health care provider is unavailable, go to the nearest emergency room for evaluation. An erection that lasts much longer than 4 hours can result in permanent damage to your penis. You have severe pain in your groin or abdomen. You develop redness or severe swelling of your penis. You have redness spreading at your groin or lower abdomen. You are unable to urinate. You experience chest pain or a rapid heartbeat (palpitations) after taking oral medicines. These symptoms may represent a serious problem that is an emergency. Do not wait to see if the symptoms will go away. Get medical help right away. Call your local emergency services (911 in the [place] Do not drive yourself to the hospital. Summary Erectile dysfunction (ED) is the inability to get or keep an erection during sexual  intercourse. This condition is diagnosed based on a physical exam, your symptoms, and tests to determine the cause. Treatment varies depending on the cause and may include medicines, hormone therapy, surgery, or a vacuum pump. You may need follow-up visits to make sure that you are using your medicines or devices correctly. Get help right away if you are taking or injecting medicines and you have an erection that lasts longer than 4 hours. This information is not intended to replace advice given to you by your health care provider. Make sure you discuss any questions you have with your health care provider. Document Revised: 02/27/2021 Document Reviewed: 02/27/2021 Elsevier Patient Education  2025 [employer]. GERD in Adults: Diet Changes When you have gastroesophageal reflux disease (GERD), you may need to make changes to your diet. Choosing the right foods can help with your symptoms. Think about working with an expert in healthy eating called a dietitian. They can help you make healthy food choices. What are tips for following this plan? Reading food labels Look for foods that are low in saturated fat. Foods that may help with your symptoms include: Foods with less than 5% of daily value (DV) of fat. Foods with 0 grams of trans fat. Cooking [employer] in ways that don't use a lot of fat. These ways include: Baking. Steaming. Grilling. Broiling. To add flavor, try to use herbs that are low in spice and acidity. Avoid frying your food. Meal planning  Eat small meals often rather than eating 3 large meals each day. Eat your meals slowly in a place where you feel relaxed. If told by your health care provider, avoid: Foods that cause symptoms. Keep a food diary to keep track of foods that cause symptoms. Alcohol. Drinking a lot of liquid with meals. General instructions For 2-3 hours after you eat, avoid: Bending over. Exercise. Lying down. Chew sugar-free gum after  meals. What foods should I eat? Eat a healthy diet. Try to include: Foods with high amounts of fiber. These include: Fruits and vegetables. Whole grains and beans. Low-fat dairy products.  Lean meats, fish, and poultry. Egg whites. Foods that cause symptoms in someone else may not cause symptoms for you. Work with your provider to find foods that are safe for you. The items listed above may not be all the foods and drinks you can have. Talk with a dietitian to learn more. The items listed above may not be a complete list of foods and beverages you can eat and drink. Contact a dietitian for more information. What foods should I avoid? Limiting some of these foods may help with your symptoms. Each person is different. Talk with a dietitian or your provider to help you find the exact foods to avoid. Some of the foods to avoid may include: Fruits Fruits with a lot of acid in them. These may include citrus fruits, such as oranges, grapefruit, pineapple, and lemons. Vegetables Deep-fried vegetables, such as [place] fries. Vegetables, sauces, or toppings made with added fat and vegetables with acid in them. These may include tomatoes and tomato products, chili peppers, onions, garlic, and horseradish. Grains Pastries or quick breads with added fat. Meats and other proteins High-fat meats, such as fatty beef or pork, hot dogs, ribs, ham, sausage, salami, and bacon. Fried meat or protein, such as fried fish and fried chicken. Egg yolks. Fats and oils Butter. Margarine. Shortening. Ghee. Drinks Coffee and other drinks with caffeine in them. Fizzy and sugary drinks, such as soda and energy drinks. Fruit juice made with acidic fruits, such as orange or grapefruit. Tomato juice. Sweets and desserts Chocolate and cocoa. Donuts. Seasonings and condiments Mint, such as peppermint and spearmint. Condiments, herbs, or seasonings that cause symptoms. These may include curry, hot sauce, or vinegar-based  salad dressings. The items listed above may not be all the foods and drinks you should avoid. Talk with a dietitian to learn more. Questions to ask your health care provider Changes to your diet and everyday life are often the first steps taken to manage symptoms of GERD. If these changes don't help, talk with your provider about taking medicines. Where to find more information International Foundation for Gastrointestinal Disorders: aboutgerd.org This information is not intended to replace advice given to you by your health care provider. Make sure you discuss any questions you have with your health care provider. Document Revised: 10/13/2023 Document Reviewed: 04/29/2023 Elsevier Patient Education  2024 [employer]. Managing Anxiety, Adult After being diagnosed with anxiety, you may be relieved to know why you have felt or behaved a certain way. You may also feel overwhelmed about the treatment ahead and what it will mean for your life. With care and support, you can manage your anxiety. How to manage lifestyle changes Understanding the difference between stress and anxiety Although stress can play a role in anxiety, it is not the same as anxiety. Stress is your body's reaction to life changes and events, both good and bad. Stress is often caused by something external, such as a deadline, test, or competition. It normally goes away after the event has ended and will last just a few hours. But, stress can be ongoing and can lead to more than just stress. Anxiety is caused by something internal, such as imagining a terrible outcome or worrying that something will go wrong that will greatly upset you. Anxiety often does not go away even after the event is over, and it can become a long-term (chronic) worry. Lowering stress and anxiety Talk with your health care provider or a counselor to learn more about lowering anxiety and  stress. They may suggest tension-reduction techniques, such as: Music.  Spend time creating or listening to music that you enjoy and that inspires you. Mindfulness-based meditation. Practice being aware of your normal breaths while not trying to control your breathing. It can be done while sitting or walking. Centering prayer. Focus on a word, phrase, or sacred image that means something to you and brings you peace. Deep breathing. Expand your stomach and inhale slowly through your nose. Hold your breath for 3-5 seconds. Then breathe out slowly, letting your stomach muscles relax. Self-talk. Learn to notice and spot thought patterns that lead to anxiety reactions. Change those patterns to thoughts that feel peaceful. Muscle relaxation. Take time to tense muscles and then relax them. Choose a tension-reduction technique that fits your lifestyle and personality. These techniques take time and practice. Set aside 5-15 minutes a day to do them. Specialized therapists can offer counseling and training in these techniques. The training to help with anxiety may be covered by some insurance plans. Other things you can do to manage stress and anxiety include: Keeping a stress diary. This can help you learn what triggers your reaction and then learn ways to manage your response. Thinking about how you react to certain situations. You may not be able to control everything, but you can control your response. Making time for activities that help you relax and not feeling guilty about spending your time in this way. Doing visual imagery. This involves imagining or creating mental pictures to help you relax. Practicing yoga. Through yoga poses, you can lower tension and relax.   Medicines Medicines for anxiety include: Antidepressant medicines. These are usually prescribed for long-term daily control. Anti-anxiety medicines. These may be added in severe cases, especially when panic attacks occur. When used together, medicines, psychotherapy, and tension-reduction techniques may be  the most effective treatment. Relationships Relationships can play a big part in helping you recover. Spend more time connecting with trusted friends and family members. Think about going to couples counseling if you have a partner, taking family education classes, or going to family therapy. Therapy can help you and others better understand your anxiety. How to recognize changes in your anxiety Everyone responds differently to treatment for anxiety. Recovery from anxiety happens when symptoms lessen and stop interfering with your daily life at home or work. This may mean that you will start to: Have better concentration and focus. Worry will interfere less in your daily thinking. Sleep better. Be less irritable. Have more energy. Have improved memory. Try to recognize when your condition is getting worse. Contact your provider if your symptoms interfere with home or work and you feel like your condition is not improving. Follow these instructions at home: Activity Exercise. Adults should: Exercise for at least 150 minutes each week. The exercise should increase your heart rate and make you sweat (moderate-intensity exercise). Do strengthening exercises at least twice a week. Get the right amount and quality of sleep. Most adults need 7-9 hours of sleep each night. Lifestyle  Eat a healthy diet that includes plenty of vegetables, fruits, whole grains, low-fat dairy products, and lean protein. Do not eat a lot of foods that are high in fats, added sugars, or salt (sodium). Make choices that simplify your life. Do not use any products that contain nicotine  or tobacco. These products include cigarettes, chewing tobacco, and vaping devices, such as e-cigarettes. If you need help quitting, ask your provider. Avoid caffeine, alcohol, and certain over-the-counter cold medicines. These may make  you feel worse. Ask your pharmacist which medicines to avoid. General instructions Take over-the-counter  and prescription medicines only as told by your provider. Keep all follow-up visits. This is to make sure you are managing your anxiety well or if you need more support. Where to find support You can get help and support from: Self-help groups. Online and [employer]. A trusted spiritual leader. Couples counseling. Family education classes. Family therapy. Where to find more information You may find that joining a support group helps you deal with your anxiety. The following sources can help you find counselors or support groups near you: Mental Health [place]: mentalhealthamerica.net Anxiety and Depression Association of [place] (ADAA): adaa.org [employer] on Mental Illness (NAMI): nami.org Contact a health care provider if: You have a hard time staying focused or finishing tasks. You spend many hours a day feeling worried about everyday life. You are very tired because you cannot stop worrying. You start to have headaches or often feel tense. You have chronic nausea or diarrhea. Get help right away if: Your heart feels like it is racing. You have shortness of breath. You have thoughts of hurting yourself or others. Get help right away if you feel like you may hurt yourself or others, or have thoughts about taking your own life. Go to your nearest emergency room or: Call 911. Call the National Suicide Prevention Lifeline at 712-132-5304 or 988. This is open 24 hours a day. Text the Crisis Text Line at 579-652-2046. This information is not intended to replace advice given to you by your health care provider. Make sure you discuss any questions you have with your health care provider. Document Revised: 09/09/2022 Document Reviewed: 03/24/2021 Elsevier Patient Education  2024 Elsevier Inc. Dyslipidemia Dyslipidemia is an imbalance of waxy, fat-like substances (lipids) in the blood. The body needs lipids in small amounts. Dyslipidemia often involves a high level of  cholesterol or triglycerides, which are types of lipids. Common forms of dyslipidemia include: High levels of LDL cholesterol. LDL is the type of cholesterol that causes fatty deposits (plaques) to build up in the blood vessels that carry blood away from the heart (arteries). Low levels of HDL cholesterol. HDL cholesterol is the type of cholesterol that protects against heart disease. High levels of HDL remove the LDL buildup from arteries. High levels of triglycerides. Triglycerides are a fatty substance in the blood that is linked to a buildup of plaques in the arteries. What are the causes? There are two main types of dyslipidemia: primary and secondary. Primary dyslipidemia is caused by changes (mutations) in genes that are passed down through families (inherited). These mutations cause several types of dyslipidemia. Secondary dyslipidemia may be caused by various risk factors that can lead to the disease, such as lifestyle choices and certain medical conditions. What increases the risk? You are more likely to develop this condition if you are an older man or if you are a woman who has gone through menopause. Other risk factors include: Having a family history of dyslipidemia. Taking certain medicines, including birth control pills, steroids, some diuretics, and beta-blockers. Eating a diet high in saturated fat. Smoking cigarettes or excessive alcohol intake. Having certain medical conditions such as diabetes, polycystic ovary syndrome (PCOS), kidney disease, liver disease, or hypothyroidism. Not exercising regularly. Being overweight or obese with too much belly fat. What are the signs or symptoms? In most cases, dyslipidemia does not usually cause any symptoms. In severe cases, very high lipid levels can cause: Fatty bumps under  the skin (xanthomas). A white or gray ring around the black center (pupil) of the eye. Very high triglyceride levels can cause inflammation of the pancreas  (pancreatitis). How is this diagnosed? Your health care provider may diagnose dyslipidemia based on a routine blood test (fasting blood test). Because most people do not have symptoms of the condition, this blood testing (lipid profile) is done on adults age 51 and older and is repeated every 4-6 years. This test checks: Total cholesterol. This measures the total amount of cholesterol in your blood, including LDL cholesterol, HDL cholesterol, and triglycerides. A healthy number is below 200 mg/dL (4.82 mmol/L). LDL cholesterol. The target number for LDL cholesterol is different for each person, depending on individual risk factors. A healthy number is usually below 100 mg/dL (7.40 mmol/L). Ask your health care provider what your LDL cholesterol should be. HDL cholesterol. An HDL level of 60 mg/dL (8.44 mmol/L) or higher is best because it helps to protect against heart disease. A number below 40 mg/dL (8.96 mmol/L) for men or below 50 mg/dL (8.70 mmol/L) for women increases the risk for heart disease. Triglycerides. A healthy triglyceride number is below 150 mg/dL (8.30 mmol/L). If your lipid profile is abnormal, your health care provider may do other blood tests. How is this treated? Treatment depends on the type of dyslipidemia that you have and your other risk factors for heart disease and stroke. Your health care provider will have a target range for your lipid levels based on this information. Treatment for dyslipidemia starts with lifestyle changes, such as diet and exercise. Your health care provider may recommend that you: Get regular exercise. Make changes to your diet. Quit smoking if you smoke. Limit your alcohol intake. If diet changes and exercise do not help you reach your goals, your health care provider may also prescribe medicine to lower lipids. The most commonly prescribed type of medicine lowers your LDL cholesterol (statin drug). If you have a high triglyceride level, your  provider may prescribe another type of drug (fibrate) or an omega-3 fish oil supplement, or both. Follow these instructions at home: Eating and drinking  Follow instructions from your health care provider or dietitian about eating or drinking restrictions. Eat a healthy diet as told by your health care provider. This can help you reach and maintain a healthy weight, lower your LDL cholesterol, and raise your HDL cholesterol. This may include: Limiting your calories, if you are overweight. Eating more fruits, vegetables, whole grains, fish, and lean meats. Limiting saturated fat, trans fat, and cholesterol. Do not drink alcohol if: Your health care provider tells you not to drink. You are pregnant, may be pregnant, or are planning to become pregnant. If you drink alcohol: Limit how much you have to: 0-1 drink a day for women. 0-2 drinks a day for men. Know how much alcohol is in your drink. In the [place] one drink equals one 12 oz bottle of beer (355 mL), one 5 oz glass of wine (148 mL), or one 1 oz glass of hard liquor (44 mL). Activity Get regular exercise. Start an exercise and strength training program as told by your health care provider. Ask your health care provider what activities are safe for you. Your health care provider may recommend: 30 minutes of aerobic activity 4-6 days a week. Brisk walking is an example of aerobic activity. Strength training 2 days a week. General instructions Do not use any products that contain nicotine  or tobacco. These products include cigarettes, chewing  tobacco, and vaping devices, such as e-cigarettes. If you need help quitting, ask your health care provider. Take over-the-counter and prescription medicines only as told by your health care provider. This includes supplements. Keep all follow-up visits. This is important. Contact a health care provider if: You are having trouble sticking to your exercise or diet plan. You are struggling to quit  smoking or to control your use of alcohol. Summary Dyslipidemia often involves a high level of cholesterol or triglycerides, which are types of lipids. Treatment depends on the type of dyslipidemia that you have and your other risk factors for heart disease and stroke. Treatment for dyslipidemia starts with lifestyle changes, such as diet and exercise. Your health care provider may prescribe medicine to lower lipids. This information is not intended to replace advice given to you by your health care provider. Make sure you discuss any questions you have with your health care provider. Document Revised: 07/04/2022 Document Reviewed: 02/04/2021 Elsevier Patient Education  2025 [employer]. Tick Bite Information, Adult  Ticks are insects that can bite. Most ticks live in bushes and grassy areas. They climb onto people and animals that go by. Then they bite. Some ticks carry germs that can make you sick. How can I prevent tick bites? Take these steps: Before you go outside: Wear long sleeves and long pants. Wear light-colored clothes. Tuck your pant legs into your socks. Use an insect repellent that has 20% or higher of the ingredients DEET, picaridin, or IR3535. Follow the instructions on the label. Put it on: Bare skin. Avoid your eyes and mouth areas. The tops of your boots. Your pant legs. The ends of your sleeves. If you use an insect repellent that has the ingredient permethrin, follow the instructions on the label. Do not put permethrin on the skin. Put it on: Clothing. Shoes. Outdoor gear. Tents. When you are outside Avoid walking through long grass. Stay in the middle of the trail. Do not touch the bushes. Check for ticks on your clothes, hair, and skin often while you are outside. Check again before you go inside. When you go indoors Check your clothes for ticks. Dry your clothes in a dryer on high heat for 10 minutes or more. If clothes are damp, more time may be  needed. Wash your clothes right away if they need to be washed. Use hot water. Check your pets and outdoor gear. Shower right away. Check your body for ticks. Do a full body check using a mirror. Check your clothes, skin, head, neck, armpits, waist, groin, and joint areas. What is the right way to remove a tick? Remove the tick from your skin as soon as possible. Do not remove the tick with your bare fingers. Do not try to remove a tick with heat, alcohol, petroleum jelly, or fingernail polish. To remove a tick that is crawling on your skin: Go outside and brush the tick off. Use tape or a lint roller. To remove a tick that is biting: Wash your hands. If you have gloves, put them on. Use tweezers, curved forceps, or a tick-removal tool to grasp the tick. Grasp the tick as close to your skin and as close to the tick's head as possible. Gently pull up until the tick lets go. Try to keep the tick's head attached to its body. Do not twist or jerk the tick. Do not squeeze or crush the tick. What should I do after taking out a tick? Clean the bite area and your hands  with soap and water, rubbing alcohol, or an iodine wash. If you have an antiseptic cream or ointment, put a small amount on the bite area. Wash and disinfect any instruments that you used to remove the tick. How should I get rid of a live tick? To get rid of a live tick, use one of these methods: Place the tick in rubbing alcohol. Place it in a bag or container you can close tightly. Throw it away. Wrap it tightly in tape. Throw it away. Flush it down the toilet. Where to find more information Centers for Disease Control and Prevention: GyrateAtrophy.si [place]. Environmental Protection Agency: RelocationNetworking.fi Contact a doctor if: You have a fever or chills. You have a red rash that makes a circle (bull's-eye rash) in the bite area. You have redness and swelling where the tick bit you. You have a headache or stiff  neck. You have pain in a muscle, joint, or bone. You are more tired than normal. You have trouble walking or moving your legs. You have numbness in your legs. You have tender or swollen lymph glands. You have belly (abdominal) pain, vomiting, watery poop (diarrhea), or weight loss. Get help right away if: You cannot remove a tick. You cannot move (have paralysis) or feel weak. You are feeling worse or have new symptoms. You find a tick that is biting you and filled with blood, especially if you are in an area where diseases from ticks are common. Summary Ticks may carry germs that can make you sick. To prevent tick bites wear long sleeves, long pants, and light colors. Use insect repellent. Follow the instructions on the label. If the tick is biting, remove it right away. Do not try to remove it with heat, alcohol, petroleum jelly, or fingernail polish. Contact a doctor if you have symptoms of a disease after being bitten by a tick. This information is not intended to replace advice given to you by your health care provider. Make sure you discuss any questions you have with your health care provider. Document Revised: 03/03/2022 Document Reviewed: 03/03/2022 Elsevier Patient Education  2024 [employer]  The above assessment and management plan was discussed with the patient. The patient verbalized understanding of and has agreed to the management plan. Patient is aware to call the clinic if they develop any new symptoms or if symptoms persist or worsen. Patient is aware when to return to the clinic for a follow-up visit. Patient educated on when it is appropriate to go to the emergency department.  Mariany Mackintosh [place] Thompson, DNP [facility] [address] [place], [place] [address] [phone]

## 2024-09-12 ENCOUNTER — Encounter: Payer: Self-pay | Admitting: Nurse Practitioner

## 2024-09-12 ENCOUNTER — Ambulatory Visit (INDEPENDENT_AMBULATORY_CARE_PROVIDER_SITE_OTHER): Admitting: Nurse Practitioner

## 2024-09-12 VITALS — BP 105/60 | HR 69 | Temp 97.4°F | Ht 69.5 in | Wt 140.8 lb

## 2024-09-12 DIAGNOSIS — W57XXXA Bitten or stung by nonvenomous insect and other nonvenomous arthropods, initial encounter: Secondary | ICD-10-CM

## 2024-09-12 DIAGNOSIS — N529 Male erectile dysfunction, unspecified: Secondary | ICD-10-CM

## 2024-09-12 DIAGNOSIS — F411 Generalized anxiety disorder: Secondary | ICD-10-CM

## 2024-09-12 DIAGNOSIS — K2100 Gastro-esophageal reflux disease with esophagitis, without bleeding: Secondary | ICD-10-CM | POA: Diagnosis not present

## 2024-09-12 DIAGNOSIS — F339 Major depressive disorder, recurrent, unspecified: Secondary | ICD-10-CM

## 2024-09-12 DIAGNOSIS — S30860A Insect bite (nonvenomous) of lower back and pelvis, initial encounter: Secondary | ICD-10-CM

## 2024-09-12 DIAGNOSIS — R110 Nausea: Secondary | ICD-10-CM | POA: Insufficient documentation

## 2024-09-12 DIAGNOSIS — E785 Hyperlipidemia, unspecified: Secondary | ICD-10-CM

## 2024-09-12 DIAGNOSIS — M545 Low back pain, unspecified: Secondary | ICD-10-CM

## 2024-09-12 MED ORDER — ONDANSETRON HCL 4 MG PO TABS: 4.0000 mg | ORAL_TABLET | Freq: Three times a day (TID) | ORAL | 1 refills | Status: AC | PRN

## 2024-09-12 MED ORDER — MELOXICAM 7.5 MG PO TABS: 7.5000 mg | ORAL_TABLET | Freq: Every day | ORAL | 0 refills | Status: DC

## 2024-09-12 MED ORDER — MIRTAZAPINE 15 MG PO TBDP: 15.0000 mg | ORAL_TABLET | Freq: Every day | ORAL | 0 refills | Status: DC

## 2024-09-14 LAB — ALPHA-GAL PANEL
Allergen Lamb IgE: 0.53 kU/L — AB
Beef IgE: 1.13 kU/L — AB
IgE (Immunoglobulin E), Serum: 86 [IU]/mL (ref 6–495)
O215-IgE Alpha-Gal: 3.5 kU/L — AB
Pork IgE: 0.22 kU/L — AB

## 2024-09-14 LAB — LYME DISEASE SEROLOGY W/REFLEX: Lyme Total Antibody EIA: NEGATIVE

## 2024-09-15 ENCOUNTER — Ambulatory Visit: Payer: Self-pay | Admitting: Nurse Practitioner

## 2024-09-15 DIAGNOSIS — T7819XA Other adverse food reactions, not elsewhere classified, initial encounter: Secondary | ICD-10-CM | POA: Insufficient documentation

## 2024-09-15 MED ORDER — EPINEPHRINE 0.3 MG/0.3ML IJ SOAJ: 0.3000 mg | INTRAMUSCULAR | 1 refills | Status: AC | PRN

## 2024-09-27 ENCOUNTER — Encounter: Payer: Self-pay | Admitting: Nurse Practitioner

## 2024-10-09 ENCOUNTER — Other Ambulatory Visit: Payer: Self-pay | Admitting: Nurse Practitioner

## 2024-10-09 DIAGNOSIS — N529 Male erectile dysfunction, unspecified: Secondary | ICD-10-CM

## 2024-10-20 DIAGNOSIS — F331 Major depressive disorder, recurrent, moderate: Secondary | ICD-10-CM | POA: Insufficient documentation

## 2024-10-20 NOTE — Progress Notes (Signed)
 Subjective:  Patient ID: Zachary Rodriguez, male    DOB: 15-Aug-1976, 48 y.o.   MRN: [id number]  Patient Care Team: Deitra Morton Hummer, Nena, NP as PCP - General (Nurse Practitioner) Elmira Newman PARAS, MD as PCP - Cardiology (Cardiology)   Chief Complaint:  Depression   HPI: Zachary Rodriguez is a 48 y.o. male presenting on 10/24/2024 for Depression   Discussed the use of AI scribe software for clinical note transcription with the patient, who gave verbal consent to proceed.  History of Present Illness Zachary Rodriguez is a 48 year old male who presents with anxiety and concerns about identity theft by his brother.  He experiences ongoing anxiety related to identity theft issues involving his brother, who has used his identity multiple times, leading to legal complications and stress. He feels anxious, paces, sweats, and describes being 'in his head'. He believes he is supposed to feel this way and has stopped taking his anxiety medication.  He has a history of identity theft by his brother, resulting in multiple warrants for his arrest without his knowledge. This has caused significant stress and anxiety, impacting his daily life and mental health. He has attempted to resolve these issues legally but has faced challenges due to his brother's actions and the legal system's response.  He is currently taking atorvastatin  and tadalafil . There was a mention of an EpiPen for alpha-gal allergy, which he was unaware of until recently. He has not been contacted about this prescription and did not pick it up from the pharmacy.  He reports a dry, flaky rash on his face, particularly around his eyebrows, nose, and chin. This rash is not currently severe but is bothersome.  .      Relevant past medical, surgical, family, and social history reviewed and updated as indicated.  Allergies and medications reviewed and updated. Data reviewed: Chart in Epic.   Past Medical History:  Diagnosis Date    Abdominal hernia    Elevated LDL cholesterol level 03/20/2020   Hyperlipidemia 05/25/24    Past Surgical History:  Procedure Laterality Date   ENDARTERECTOMY FEMORAL Left 05/25/2024   Procedure: LEFT COMMON FEMORAL ENDARTERECTOMY WITH BOVINE PATCH;  Surgeon: Gretta Lonni PARAS, MD;  Location: [facility] OR;  Service: Vascular;  Laterality: Left;   HERNIA REPAIR     MOUTH SURGERY     THROMBECTOMY FEMORAL ARTERY Left 05/25/2024   Procedure: LEFT LOWER EXTRIMITY THROMBECTOMY, ARTERY, FEMORAL;  Surgeon: Gretta Lonni PARAS, MD;  Location: [facility] OR;  Service: Vascular;  Laterality: Left;    Social History   Socioeconomic History   Marital status: Married    Spouse name: Not on file   Number of children: Not on file   Years of education: Not on file   Highest education level: 12th grade  Occupational History   Not on file  Tobacco Use   Smoking status: Former    Average packs/day: 1.5 packs/day for 28.4 years (42.7 ttl pk-yrs)    Types: Cigarettes    Start date: 1997   Smokeless tobacco: Never  Vaping Use   Vaping status: Never Used  Substance and Sexual Activity   Alcohol use: No   Drug use: Not Currently    Types: Marijuana    Comment: 03/19/20- states no drugs    Sexual activity: Not on file  Other Topics Concern   Not on file  Social History Narrative   Not on file   Social Drivers of Health   Financial  Resource Strain: Medium Risk (09/11/2024)   Overall Financial Resource Strain (CARDIA)    Difficulty of Paying Living Expenses: Somewhat hard  Food Insecurity: No Food Insecurity (09/11/2024)   Hunger Vital Sign    Worried About Running Out of Food in the Last Year: Never true    Ran Out of Food in the Last Year: Never true  Transportation Needs: No Transportation Needs (09/11/2024)   PRAPARE - Administrator, Civil Service (Medical): No    Lack of Transportation (Non-Medical): No  Physical Activity: Not on file  Stress: Not on file  Social Connections:  Moderately Isolated (09/11/2024)   Social Connection and Isolation Panel    Frequency of Communication with Friends and Family: Twice a week    Frequency of Social Gatherings with Friends and Family: Once a week    Attends Religious Services: Never    Database Administrator or Organizations: No    Attends Engineer, Structural: Not on file    Marital Status: Married  Catering Manager Violence: Not At Risk (05/24/2024)   Humiliation, Afraid, Rape, and Kick questionnaire    Fear of Current or Ex-Partner: No    Emotionally Abused: No    Physically Abused: No    Sexually Abused: No    Outpatient Encounter Medications as of 10/24/2024  Medication Sig   aspirin  EC 81 MG tablet Take 1 tablet (81 mg total) by mouth daily. Swallow whole.   atorvastatin  (LIPITOR ) 80 MG tablet Take 1 tablet (80 mg total) by mouth daily.   docusate sodium  (COLACE) 100 MG capsule Take 100 mg by mouth 2 (two) times daily.   EPINEPHrine 0.3 mg/0.3 mL IJ SOAJ injection Inject 0.3 mg into the muscle as needed for anaphylaxis.   ketoconazole (NIZORAL) 2 % cream Apply 1 Application topically daily.   meloxicam  (MOBIC ) 7.5 MG tablet Take 1 tablet (7.5 mg total) by mouth daily.   omeprazole  (PRILOSEC) 20 MG capsule Take 1 capsule (20 mg total) by mouth daily.   ondansetron  (ZOFRAN ) 4 MG tablet Take 1 tablet (4 mg total) by mouth every 8 (eight) hours as needed for nausea or vomiting.   tadalafil , PAH, (ADCIRCA ) 20 MG tablet TAKE 1 TABLET BY MOUTH EVERY DAY   busPIRone  (BUSPAR ) 7.5 MG tablet Take 1 tablet (7.5 mg total) by mouth 2 (two) times daily. (Patient not taking: Reported on 10/24/2024)   mirtazapine (REMERON SOL-TAB) 15 MG disintegrating tablet Take 1 tablet (15 mg total) by mouth at bedtime. (Patient not taking: Reported on 10/24/2024)   No facility-administered encounter medications on file as of 10/24/2024.    No Known Allergies  Pertinent ROS per HPI, otherwise unremarkable      Objective:  BP  117/66   Pulse 70   Temp 98.4 F (36.9 C)   Ht 5' 9.5 (1.765 m)   Wt 143 lb 9.6 oz (65.1 kg)   SpO2 95%   BMI 20.90 kg/m    Wt Readings from Last 3 Encounters:  10/24/24 143 lb 9.6 oz (65.1 kg)  09/12/24 140 lb 12.8 oz (63.9 kg)  08/08/24 142 lb (64.4 kg)    Physical Exam Vitals and nursing note reviewed.  Constitutional:      General: He is not in acute distress.    Appearance: Normal appearance.  HENT:     Head: Normocephalic and atraumatic.     Nose: Nose normal.  Eyes:     General: No scleral icterus.    Extraocular Movements: Extraocular movements  intact.     Conjunctiva/sclera: Conjunctivae normal.     Pupils: Pupils are equal, round, and reactive to light.  Cardiovascular:     Heart sounds: Normal heart sounds.  Pulmonary:     Effort: Pulmonary effort is normal.     Breath sounds: Normal breath sounds.  Musculoskeletal:        General: Normal range of motion.     Right lower leg: No edema.     Left lower leg: No edema.  Skin:    General: Skin is warm and dry.     Findings: No rash.  Neurological:     Mental Status: He is alert and oriented to person, place, and time.  Psychiatric:        Attention and Perception: Attention and perception normal.        Mood and Affect: Affect normal. Mood is anxious.        Speech: Speech normal.        Behavior: Behavior is cooperative.        Thought Content: Thought content normal. Thought content does not include homicidal or suicidal ideation. Thought content does not include homicidal or suicidal plan.        Cognition and Memory: Cognition and memory normal.        Judgment: Judgment normal.    Physical Exam      Results for orders placed or performed in visit on 09/12/24  Alpha-Gal Panel   Collection Time: 09/12/24 10:19 AM  Result Value Ref Range   Class Description Allergens Comment    IgE (Immunoglobulin E), Serum 86 6 - 495 IU/mL   Pork IgE 0.22 (A) Class 0/I kU/L   Beef IgE 1.13 (A) Class II kU/L    Allergen Lamb IgE 0.53 (A) Class I kU/L   O215-IgE Alpha-Gal 3.50 (A) Class III kU/L  Lyme Disease Serology w/Reflex   Collection Time: 09/12/24 10:19 AM  Result Value Ref Range   Lyme Total Antibody EIA Negative Negative       Pertinent labs & imaging results that were available during my care of the patient were reviewed by me and considered in my medical decision making.  Assessment & Plan:  Kiven was seen today for depression.  Diagnoses and all orders for this visit:  Gastroesophageal reflux disease with esophagitis without hemorrhage  Erectile dysfunction, unspecified erectile dysfunction type  Mixed hyperlipidemia  Moderate episode of recurrent major depressive disorder (HCC)  Generalized anxiety disorder  Screening for colon cancer -     Cologuard  Seborrheic dermatitis -     ketoconazole (NIZORAL) 2 % cream; Apply 1 Application topically daily.     Assessment and Plan Zachary Rodriguez is a 48 year old Caucasian male seen today for chronic disease management, no acute distress Assessment & Plan Anxiety Experiences anxiety with pacing, sweating, and feeling 'in his head'. Expresses frustration and anger towards brother's actions. Medication aids symptom management. Discontinued medication, agreed to maintain status until next appointment. - Continue current anxiety medication regimen; Remeron 15 mg at bedtime and BuSpar  7.5 mg twice a day - Follow up in four weeks.   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 Unaware of EpiPen prescription. Emphasized EpiPen importance and advised red meat avoidance. - Ensure he has an EpiPen available. - Advise to avoid red meat and monitor for allergic reactions.  Seborrheic Dermatitis Reports dry, flaky rash on eyebrows, nose bridge, and face. Treatment plan in place. - Prescribe nasal oil for facial  washing. - Instruct to let the oil sit for 1-5 minutes before  rinsing.  Colorectal Cancer Screening Discussed Cologuard as a non-invasive alternative to colonoscopy. - Order Cologuard test. - Instruct to send out the Cologuard sample within 72 hours of receipt.     Continue all other maintenance medications.  Follow up plan: Return in about 4 weeks (around 11/21/2024) for medication titration.   Continue healthy lifestyle choices, including diet (rich in fruits, vegetables, and lean proteins, and low in salt and simple carbohydrates) and exercise (at least 30 minutes of moderate physical activity daily).  Educational handout given for    Clinical References  Erectile Dysfunction Erectile dysfunction (ED) is the inability to get or keep an erection in order to have sexual intercourse. ED is considered a symptom of an underlying disorder and is not considered a disease. ED may include: Inability to get an erection. Lack of enough hardness of the erection to allow penetration. Loss of erection before sex is finished. What are the causes? This condition may be caused by: Physical causes, such as: Artery problems. This may include heart disease, high blood pressure, atherosclerosis, and diabetes. Hormonal problems, such as low testosterone. Obesity. Nerve problems. This may include back or pelvic injuries, multiple sclerosis, Parkinson's disease, spinal cord injury, and stroke. Certain medicines, such as: Pain relievers. Antidepressants. Blood pressure medicines and water pills (diuretics). Cancer medicines. Antihistamines. Muscle relaxants. Lifestyle factors, such as: Use of drugs such as marijuana, cocaine, or opioids. Excessive use of alcohol. Smoking. Lack of physical activity or exercise. Psychological causes, such as: Anxiety or stress. Sadness or depression. Exhaustion. Fear about sexual performance. Guilt. What are the signs or symptoms? Symptoms of this condition include: Inability to get an erection. Lack of enough  hardness of the erection to allow penetration. Loss of the erection before sex is finished. Sometimes having normal erections, but with frequent unsatisfactory episodes. Low sexual satisfaction in either partner due to erection problems. A curved penis occurring with erection. The curve may cause pain, or the penis may be too curved to allow for intercourse. Never having nighttime or morning erections. How is this diagnosed? This condition is often diagnosed by: Performing a physical exam to find other diseases or specific problems with the penis. Asking you detailed questions about the problem. Doing tests, such as: Blood tests to check for diabetes mellitus or high cholesterol, or to measure hormone levels. Other tests to check for underlying health conditions. An ultrasound exam to check for scarring. A test to check blood flow to the penis. Doing a sleep study at home to measure nighttime erections. How is this treated? This condition may be treated by: Medicines, such as: Medicine taken by mouth to help you achieve an erection (oral medicine). Hormone replacement therapy to replace low testosterone levels. Medicine that is injected into the penis. Your health care provider may instruct you how to give yourself these injections at home. Medicine that is delivered with a short applicator tube. The tube is inserted into the opening at the tip of the penis, which is the opening of the urethra. A tiny pellet of medicine is put in the urethra. The pellet dissolves and enhances erectile function. This is also called MUSE (medicated urethral system for erections) therapy. Vacuum pump. This is a pump with a ring on it. The pump and ring are placed on the penis and used to create pressure that helps the penis become erect. Penile implant surgery. In this procedure, you may receive: An  inflatable implant. This consists of cylinders, a pump, and a reservoir. The cylinders can be inflated with a  fluid that helps to create an erection, and they can be deflated after intercourse. A semi-rigid implant. This consists of two silicone rubber rods. The rods provide some rigidity. They are also flexible, so the penis can both curve downward in its normal position and become straight for sexual intercourse. Blood vessel surgery to improve blood flow to the penis. During this procedure, a blood vessel from a different part of the body is placed into the penis to allow blood to flow around (bypass) damaged or blocked blood vessels. Lifestyle changes, such as exercising more, losing weight, and quitting smoking. Follow these instructions at home: Medicines  Take over-the-counter and prescription medicines only as told by your health care provider. Do not increase the dosage without first discussing it with your health care provider. If you are using self-injections, do injections as directed by your health care provider. Make sure you avoid any veins that are on the surface of the penis. After giving an injection, apply pressure to the injection site for 5 minutes. Talk to your health care provider about how to prevent headaches while taking ED medicines. These medicines may cause a sudden headache due to the increase in blood flow in your body. General instructions Exercise regularly, as directed by your health care provider. Work with your health care provider to lose weight, if needed. Do not use any products that contain nicotine  or tobacco. These products include cigarettes, chewing tobacco, and vaping devices, such as e-cigarettes. If you need help quitting, ask your health care provider. Before using a vacuum pump, read the instructions that come with the pump and discuss any questions with your health care provider. Keep all follow-up visits. This is important. Contact a health care provider if: You feel nauseous. You are vomiting. You get sudden headaches while taking ED medicines. You have  any concerns about your sexual health. Get help right away if: You are taking oral or injectable medicines and you have an erection that lasts longer than 4 hours. If your health care provider is unavailable, go to the nearest emergency room for evaluation. An erection that lasts much longer than 4 hours can result in permanent damage to your penis. You have severe pain in your groin or abdomen. You develop redness or severe swelling of your penis. You have redness spreading at your groin or lower abdomen. You are unable to urinate. You experience chest pain or a rapid heartbeat (palpitations) after taking oral medicines. These symptoms may represent a serious problem that is an emergency. Do not wait to see if the symptoms will go away. Get medical help right away. Call your local emergency services (911 in the [place] Do not drive yourself to the hospital. Summary Erectile dysfunction (ED) is the inability to get or keep an erection during sexual intercourse. This condition is diagnosed based on a physical exam, your symptoms, and tests to determine the cause. Treatment varies depending on the cause and may include medicines, hormone therapy, surgery, or a vacuum pump. You may need follow-up visits to make sure that you are using your medicines or devices correctly. Get help right away if you are taking or injecting medicines and you have an erection that lasts longer than 4 hours. This information is not intended to replace advice given to you by your health care provider. Make sure you discuss any questions you have with your health care  provider. Document Revised: 02/27/2021 Document Reviewed: 02/27/2021 Elsevier Patient Education  2025 [employer]. Managing Anxiety, Adult After being diagnosed with anxiety, you may be relieved to know why you have felt or behaved a certain way. You may also feel overwhelmed about the treatment ahead and what it will mean for your life. With care and support,  you can manage your anxiety. How to manage lifestyle changes Understanding the difference between stress and anxiety Although stress can play a role in anxiety, it is not the same as anxiety. Stress is your body's reaction to life changes and events, both good and bad. Stress is often caused by something external, such as a deadline, test, or competition. It normally goes away after the event has ended and will last just a few hours. But, stress can be ongoing and can lead to more than just stress. Anxiety is caused by something internal, such as imagining a terrible outcome or worrying that something will go wrong that will greatly upset you. Anxiety often does not go away even after the event is over, and it can become a long-term (chronic) worry. Lowering stress and anxiety Talk with your health care provider or a counselor to learn more about lowering anxiety and stress. They may suggest tension-reduction techniques, such as: Music. Spend time creating or listening to music that you enjoy and that inspires you. Mindfulness-based meditation. Practice being aware of your normal breaths while not trying to control your breathing. It can be done while sitting or walking. Centering prayer. Focus on a word, phrase, or sacred image that means something to you and brings you peace. Deep breathing. Expand your stomach and inhale slowly through your nose. Hold your breath for 3-5 seconds. Then breathe out slowly, letting your stomach muscles relax. Self-talk. Learn to notice and spot thought patterns that lead to anxiety reactions. Change those patterns to thoughts that feel peaceful. Muscle relaxation. Take time to tense muscles and then relax them. Choose a tension-reduction technique that fits your lifestyle and personality. These techniques take time and practice. Set aside 5-15 minutes a day to do them. Specialized therapists can offer counseling and training in these techniques. The training to help  with anxiety may be covered by some insurance plans. Other things you can do to manage stress and anxiety include: Keeping a stress diary. This can help you learn what triggers your reaction and then learn ways to manage your response. Thinking about how you react to certain situations. You may not be able to control everything, but you can control your response. Making time for activities that help you relax and not feeling guilty about spending your time in this way. Doing visual imagery. This involves imagining or creating mental pictures to help you relax. Practicing yoga. Through yoga poses, you can lower tension and relax.   Medicines Medicines for anxiety include: Antidepressant medicines. These are usually prescribed for long-term daily control. Anti-anxiety medicines. These may be added in severe cases, especially when panic attacks occur. When used together, medicines, psychotherapy, and tension-reduction techniques may be the most effective treatment. Relationships Relationships can play a big part in helping you recover. Spend more time connecting with trusted friends and family members. Think about going to couples counseling if you have a partner, taking family education classes, or going to family therapy. Therapy can help you and others better understand your anxiety. How to recognize changes in your anxiety Everyone responds differently to treatment for anxiety. Recovery from anxiety happens when symptoms lessen  and stop interfering with your daily life at home or work. This may mean that you will start to: Have better concentration and focus. Worry will interfere less in your daily thinking. Sleep better. Be less irritable. Have more energy. Have improved memory. Try to recognize when your condition is getting worse. Contact your provider if your symptoms interfere with home or work and you feel like your condition is not improving. Follow these instructions at  home: Activity Exercise. Adults should: Exercise for at least 150 minutes each week. The exercise should increase your heart rate and make you sweat (moderate-intensity exercise). Do strengthening exercises at least twice a week. Get the right amount and quality of sleep. Most adults need 7-9 hours of sleep each night. Lifestyle  Eat a healthy diet that includes plenty of vegetables, fruits, whole grains, low-fat dairy products, and lean protein. Do not eat a lot of foods that are high in fats, added sugars, or salt (sodium). Make choices that simplify your life. Do not use any products that contain nicotine  or tobacco. These products include cigarettes, chewing tobacco, and vaping devices, such as e-cigarettes. If you need help quitting, ask your provider. Avoid caffeine, alcohol, and certain over-the-counter cold medicines. These may make you feel worse. Ask your pharmacist which medicines to avoid. General instructions Take over-the-counter and prescription medicines only as told by your provider. Keep all follow-up visits. This is to make sure you are managing your anxiety well or if you need more support. Where to find support You can get help and support from: Self-help groups. Online and [employer]. A trusted spiritual leader. Couples counseling. Family education classes. Family therapy. Where to find more information You may find that joining a support group helps you deal with your anxiety. The following sources can help you find counselors or support groups near you: Mental Health America: mentalhealthamerica.net Anxiety and Depression Association of America (ADAA): adaa.org [employer] on Mental Illness (NAMI): nami.org Contact a health care provider if: You have a hard time staying focused or finishing tasks. You spend many hours a day feeling worried about everyday life. You are very tired because you cannot stop worrying. You start to have headaches  or often feel tense. You have chronic nausea or diarrhea. Get help right away if: Your heart feels like it is racing. You have shortness of breath. You have thoughts of hurting yourself or others. Get help right away if you feel like you may hurt yourself or others, or have thoughts about taking your own life. Go to your nearest emergency room or: Call 911. Call the National Suicide Prevention Lifeline at 8121355342 or 988. This is open 24 hours a day. Text the Crisis Text Line at 650 583 9028. This information is not intended to replace advice given to you by your health care provider. Make sure you discuss any questions you have with your health care provider. Document Revised: 09/09/2022 Document Reviewed: 03/24/2021 Elsevier Patient Education  2024 [employer] Managing Depression, Adult Depression is a mental health condition that affects your thoughts, feelings, and actions. Being diagnosed with depression can bring you relief if you did not know why you have felt or behaved a certain way. It could also leave you feeling overwhelmed. Finding ways to manage your symptoms can help you feel more positive about your future. How to manage lifestyle changes Being depressed is difficult. Depression can increase the level of everyday stress. Stress can make depression symptoms worse. You may believe your symptoms cannot be managed  or will never improve. However, there are many things you can try to help manage your symptoms. There is hope. Managing stress  Stress is your body's reaction to life changes and events, both good and bad. Stress can add to your feelings of depression. Learning to manage your stress can help lessen your feelings of depression. Try some of the following approaches to reducing your stress (stress reduction techniques): Listen to music that you enjoy and that inspires you. Try using a meditation app or take a meditation class. Develop a practice that helps you connect  with your spiritual self. Walk in nature, pray, or go to a place of worship. Practice deep breathing. To do this, inhale slowly through your nose. Pause at the top of your inhale for a few seconds and then exhale slowly, letting yourself relax. Repeat this three or four times. Practice yoga to help relax and work your muscles. Choose a stress reduction technique that works for you. These techniques take time and practice to develop. Set aside 5-15 minutes a day to do them. Therapists can offer training in these techniques. Do these things to help manage stress: Keep a journal. Know your limits. Set healthy boundaries for yourself and others, such as saying no when you think something is too much. Pay attention to how you react to certain situations. You may not be able to control everything, but you can change your reaction. Add humor to your life by watching funny movies or shows. Make time for activities that you enjoy and that relax you. Spend less time using electronics, especially at night before bed. The light from screens can make your brain think it is time to get up rather than go to bed.   Medicines Medicines, such as antidepressants, are often a part of treatment for depression. Talk with your pharmacist or health care provider about all the medicines, supplements, and herbal products that you take, their possible side effects, and what medicines and other products are safe to take together. Make sure to report any side effects you may have to your health care provider. Relationships Your health care provider may suggest family therapy, couples therapy, or individual therapy as part of your treatment. How to recognize changes Everyone responds differently to treatment for depression. As you recover from depression, you may start to: Have more interest in doing activities. Feel more hopeful. Have more energy. Eat a more regular amount of food. Have better mental focus. It is  important to recognize if your depression is not getting better or is getting worse. The symptoms you had in the beginning may return, such as: Feeling tired. Eating too much or too little. Sleeping too much or too little. Feeling restless, agitated, or hopeless. Trouble focusing or making decisions. Having unexplained aches and pains. Feeling irritable, angry, or aggressive. If you or your family members notice these symptoms coming back, let your health care provider know right away. Follow these instructions at home: Activity Try to get some form of exercise each day, such as walking. Try yoga, mindfulness, or other stress reduction techniques. Participate in group activities if you are able. Lifestyle Get enough sleep. Cut down on or stop using caffeine, tobacco, alcohol, and any other harmful substances. Eat a healthy diet that includes plenty of vegetables, fruits, whole grains, low-fat dairy products, and lean protein. Limit foods that are high in solid fats, added sugar, or salt (sodium). General instructions Take over-the-counter and prescription medicines only as told by your health care  provider. Keep all follow-up visits. It is important for your health care provider to check on your mood, behavior, and medicines. Your health care provider may need to make changes to your treatment. Where to find support Talking to others  Friends and family members can be sources of support and guidance. Talk to trusted friends or family members about your condition. Explain your symptoms and let them know that you are working with a health care provider to treat your depression. Tell friends and family how they can help. Finances Find mental health providers that fit with your financial situation. Talk with your health care provider if you are worried about access to food, housing, or medicine. Call your insurance company to learn about your co-pays and prescription plan. Where to find more  information You can find support in your area from: Anxiety and Depression Association of America (ADAA): adaa.org Mental Health America: mentalhealthamerica.net The [employer] on Mental Illness: nami.org Contact a health care provider if: You stop taking your antidepressant medicines, and you have any of these symptoms: Nausea. Headache. Light-headedness. Chills and body aches. Not being able to sleep (insomnia). You or your friends and family think your depression is getting worse. Get help right away if: You have thoughts of hurting yourself or others. Get help right away if you feel like you may hurt yourself or others, or have thoughts about taking your own life. Go to your nearest emergency room or: Call 911. Call the National Suicide Prevention Lifeline at 419-781-4686 or 988. This is open 24 hours a day. Text the Crisis Text Line at 607-507-9642. This information is not intended to replace advice given to you by your health care provider. Make sure you discuss any questions you have with your health care provider. Document Revised: 04/08/2022 Document Reviewed: 04/08/2022 Elsevier Patient Education  2024 Elsevier Inc. Colonoscopy, Adult A colonoscopy is a procedure to look at the entire large intestine. This procedure is done using a long, thin, flexible tube that has a camera on the end. You may have a colonoscopy: As a part of normal colorectal screening. If you have certain symptoms, such as: A low number of red blood cells in your blood (anemia). Diarrhea that does not go away. Pain in your abdomen. Blood in your stool. A colonoscopy can help screen for and diagnose medical problems, including: An abnormal growth of cells or tissue (tumor). Abnormal growths within the lining of your intestine (polyps). Inflammation. Areas of bleeding. Tell your health care provider about: Any allergies you have. All medicines you are taking, including vitamins, herbs, eye drops,  creams, and over-the-counter medicines. Any problems you or family members have had with anesthetic medicines. Any bleeding problems you have. Any surgeries you have had. Any medical conditions you have. Any problems you have had with having bowel movements. Whether you are pregnant or may be pregnant. What are the risks? Generally, this is a safe procedure. However, problems may occur, including: Bleeding. Damage to your intestine. Allergic reactions to medicines given during the procedure. Infection. This is rare. What happens before the procedure? Eating and drinking restrictions Follow instructions from your health care provider about eating or drinking restrictions, which may include: A few days before the procedure: Follow a low-fiber diet. Avoid nuts, seeds, dried fruit, raw fruits, and vegetables. 1-3 days before the procedure: Eat only gelatin dessert or ice pops. Drink only clear liquids, such as water, clear juice, clear broth or bouillon, black coffee or tea, or clear soft drinks or  sports drinks. Avoid liquids that contain red or purple dye. The day of the procedure: Do not eat solid foods. You may continue to drink clear liquids until up to 2 hours before the procedure. Do not eat or drink anything starting 2 hours before the procedure, or within the time period that your health care provider recommends. Bowel prep If you were prescribed a bowel prep to take by mouth (orally) to clean out your colon: Take it as told by your health care provider. Starting the day before your procedure, you will need to drink a large amount of liquid medicine. The liquid will cause you to have many bowel movements of loose stool until your stool becomes almost clear or light green. If your skin or the opening between the buttocks (anus) gets irritated from diarrhea, you may relieve the irritation using: Wipes with medicine in them, such as adult wet wipes with aloe and vitamin E. A product  to soothe skin, such as petroleum jelly. If you vomit while drinking the bowel prep: Take a break for up to 60 minutes. Begin the bowel prep again. Call your health care provider if you keep vomiting or you cannot take the bowel prep without vomiting. To clean out your colon, you may also be given: Laxative medicines. These help you have a bowel movement. Instructions for enema use. An enema is liquid medicine injected into your rectum. Medicines Ask your health care provider about: Changing or stopping your regular medicines or supplements. This is especially important if you are taking iron supplements, diabetes medicines, or blood thinners. Taking medicines such as aspirin  and ibuprofen . These medicines can thin your blood. Do not take these medicines unless your health care provider tells you to take them. Taking over-the-counter medicines, vitamins, herbs, and supplements. General instructions Ask your health care provider what steps will be taken to help prevent infection. These may include washing skin with a germ-killing soap. If you will be going home right after the procedure, plan to have a responsible adult: Take you home from the hospital or clinic. You will not be allowed to drive. Care for you for the time you are told. What happens during the procedure?  An IV will be inserted into one of your veins. You will be given a medicine to make you fall asleep (general anesthetic). You will lie on your side with your knees bent. A lubricant will be put on the tube. Then the tube will be: Inserted into your anus. Gently eased through all parts of your large intestine. Air will be sent into your colon to keep it open. This may cause some pressure or cramping. Images will be taken with the camera and will appear on a screen. A small tissue sample may be removed to be looked at under a microscope (biopsy). The tissue may be sent to a lab for testing if any signs of problems are  found. If small polyps are found, they may be removed and checked for cancer cells. When the procedure is finished, the tube will be removed. The procedure may vary among health care providers and hospitals. What happens after the procedure? Your blood pressure, heart rate, breathing rate, and blood oxygen level will be monitored until you leave the hospital or clinic. You may have a small amount of blood in your stool. You may pass gas and have mild cramping or bloating in your abdomen. This is caused by the air that was used to open your colon during the exam.  If you were given a sedative during the procedure, it can affect you for several hours. Do not drive or operate machinery until your health care provider says that it is safe. It is up to you to get the results of your procedure. Ask your health care provider, or the department that is doing the procedure, when your results will be ready. Summary A colonoscopy is a procedure to look at the entire large intestine. Follow instructions from your health care provider about eating and drinking before the procedure. If you were prescribed an oral bowel prep to clean out your colon, take it as told by your health care provider. During the colonoscopy, a flexible tube with a camera on its end is inserted into the anus and then passed into all parts of the large intestine. This information is not intended to replace advice given to you by your health care provider. Make sure you discuss any questions you have with your health care provider. Document Revised: 01/13/2023 Document Reviewed: 07/24/2021 Elsevier Patient Education  2024 Elsevier Inc. Seborrheic Dermatitis, Adult Seborrheic dermatitis is a skin disease that causes red, scaly patches. It often occurs on the scalp, where it may be called dandruff. The patches may also appear on other parts of the body. Skin patches tend to occur where there are a lot of oil glands in the skin. Areas of the  body that may be affected include: The scalp. The face, eyebrows, and ears. The area around a beard. Skin folds of the body. This includes the armpits, groin, and buttocks. The chest. The condition is often long-lasting (chronic). It may come and go for no known reason. It may be activated by a trigger, such as: Cold weather. Being out in the sun. Stress. Drinking alcohol. What are the causes? The cause of this condition is not known. It may be related to having too much yeast on the skin or changes in how your body's disease-fighting system (immune system) works. What increases the risk? You may be more likely to develop this condition if: You have a weak immune system. You are 5 years old or older. You have other conditions, such as: Human immunodeficiency virus (HIV) or acquired immunodeficiency virus (AIDS). Parkinson's disease. Mood disorders, such as depression. Liver problems. Obesity. What are the signs or symptoms? Symptoms of this condition include: Thick scales on the scalp. Redness on the face or in the armpits. Skin that is flaky. The flakes may be white or yellow. Skin that seems oily or dry but is not helped with moisturizers. Itching or burning in the affected areas. How is this diagnosed? This condition is diagnosed with a medical history and physical exam. A sample of your skin may be tested (skin biopsy). You may need to see a skin specialist (dermatologist). How is this treated? There is no cure for this condition, but treatment can help to manage the symptoms. You may get treatment to remove scales, lower the risk of skin infection, and reduce swelling or itching. Treatment may include: Medicated shampoos, moisturizing creams, or ointments. Creams that reduce skin yeast. Creams that reduce swelling and irritation (steroids). Follow these instructions at home: Skin care Use any medicated shampoo, skin creams, or ointments only as told by your health care  provider. Do not use skin products that contain alcohol. Take lukewarm baths or showers. Avoid very hot water. When you are outside, wear a hat and clothes that block UV light. General instructions Apply over-the-counter and prescription medicines only as  told by your health care provider. Learn what triggers your symptoms so you can avoid these things. Use techniques for stress reduction, such as meditation or yoga. Do not drink alcohol if your health care provider tells you not to drink. Keep all follow-up visits. Your health care provider will check your skin to make sure the treatments are helping. Where to find more information American Academy of Dermatology: marketingsheets.si Contact a health care provider if: Your symptoms do not get better with treatment. Your symptoms get worse. You have new symptoms. Get help right away if: Your condition quickly gets worse, even with treatment. This information is not intended to replace advice given to you by your health care provider. Make sure you discuss any questions you have with your health care provider. Document Revised: 05/02/2022 Document Reviewed: 05/02/2022 Elsevier Patient Education  2024 Elsevier Inc.  The above assessment and management plan was discussed with the patient. The patient verbalized understanding of and has agreed to the management plan. Patient is aware to call the clinic if they develop any new symptoms or if symptoms persist or worsen. Patient is aware when to return to the clinic for a follow-up visit. Patient educated on when it is appropriate to go to the emergency department.   Seynabou Fults [place] Thompson, DNP [facility] [address] [place], [place] [address] [phone]

## 2024-10-24 ENCOUNTER — Ambulatory Visit: Payer: Self-pay | Admitting: Nurse Practitioner

## 2024-10-24 ENCOUNTER — Encounter: Payer: Self-pay | Admitting: Nurse Practitioner

## 2024-10-24 VITALS — BP 117/66 | HR 70 | Temp 98.4°F | Ht 69.5 in | Wt 143.6 lb

## 2024-10-24 DIAGNOSIS — N529 Male erectile dysfunction, unspecified: Secondary | ICD-10-CM | POA: Diagnosis not present

## 2024-10-24 DIAGNOSIS — Z1211 Encounter for screening for malignant neoplasm of colon: Secondary | ICD-10-CM

## 2024-10-24 DIAGNOSIS — F331 Major depressive disorder, recurrent, moderate: Secondary | ICD-10-CM

## 2024-10-24 DIAGNOSIS — L219 Seborrheic dermatitis, unspecified: Secondary | ICD-10-CM

## 2024-10-24 DIAGNOSIS — E782 Mixed hyperlipidemia: Secondary | ICD-10-CM

## 2024-10-24 DIAGNOSIS — K2100 Gastro-esophageal reflux disease with esophagitis, without bleeding: Secondary | ICD-10-CM

## 2024-10-24 DIAGNOSIS — F411 Generalized anxiety disorder: Secondary | ICD-10-CM

## 2024-10-24 MED ORDER — KETOCONAZOLE 2 % EX CREA: 1.0000 | TOPICAL_CREAM | Freq: Every day | CUTANEOUS | 0 refills | Status: AC

## 2024-11-05 LAB — COLOGUARD: COLOGUARD: POSITIVE — AB

## 2024-11-07 ENCOUNTER — Ambulatory Visit: Payer: Self-pay | Admitting: Nurse Practitioner

## 2024-11-07 DIAGNOSIS — R195 Other fecal abnormalities: Secondary | ICD-10-CM

## 2024-11-08 ENCOUNTER — Encounter: Payer: Self-pay | Admitting: Cardiology

## 2024-11-08 ENCOUNTER — Ambulatory Visit: Admitting: Nurse Practitioner

## 2024-11-08 ENCOUNTER — Ambulatory Visit: Attending: Cardiology | Admitting: Cardiology

## 2024-11-08 VITALS — BP 129/52 | HR 59 | Ht 70.0 in | Wt 140.0 lb

## 2024-11-08 DIAGNOSIS — I739 Peripheral vascular disease, unspecified: Secondary | ICD-10-CM

## 2024-11-08 DIAGNOSIS — F17210 Nicotine dependence, cigarettes, uncomplicated: Secondary | ICD-10-CM | POA: Diagnosis present

## 2024-11-08 MED ORDER — MELOXICAM 7.5 MG PO TABS: 7.5000 mg | ORAL_TABLET | ORAL | Status: DC | PRN

## 2024-11-08 NOTE — Progress Notes (Signed)
 Cardiology Office Note:  .   Date:  11/08/2024  ID:  Glendia CHRISTELLA Meo, DOB Dec 01, 1976, MRN [id number] PCP: Deitra Morton Sebastian Nena, NP  [facility] Providers Cardiologist:  Newman Lawrence, MD PCP: Deitra Morton Sebastian Nena, NP  Chief Complaint  Patient presents with   Leg Pain     MIKYLE SOX is a 48 y.o. male with nicotine  dependence, PAD s/p left femoral thrombectomy and endarterectomy (05/2024)  Discussed the use of AI scribe software for clinical note transcription with the patient, who gave verbal consent to proceed.  History of Present Illness JEREMY DITULLIO is a 48 year old male who presents with recurrent leg pain.  He has recurrent left leg pain that began months ago when a complete arterial blockage was found and treated with angioplasty, which initially relieved his symptoms. The pain has now returned, though less severe. He describes a pulsing muscle spasm in the left thigh that is more noticeable after exertion, such as carrying a backpack blower. This pain feels different from the severe, prolonged pain he had before angioplasty, which he had thought was a pulled groin muscle. He is worried about possible nerve injury from the procedure but notes he remains active.  He takes aspirin  81 mg daily, Lipitor  80 mg, and uses meloxicam  as needed. He is working on smoking cessation and has reduced from two packs per day to one, with plans to start nicotine  patches.  He notes intermittent muscle spasms in the left thigh but denies severe pain, weakness, or loss of strength. His legs feel strong with walking, although he can tell when he needs to rest. Patient underwent thrombectomy of left common femoral artery, profunda as well as SFA, followed by femoral endarterectomy with bovine pericardial patch angioplasty.     Vitals:   11/08/24 1008  BP: (!) 129/52  Pulse: (!) 59  SpO2: 95%      Review of Systems  Cardiovascular:  Negative for chest pain, dyspnea  on exertion, leg swelling, palpitations and syncope.  Musculoskeletal:        Left leg pain        Studies Reviewed: SABRA        EKG 08/03/2023: Sinus rhythm 56 bpm w/sinus arrhtymia  Echocardiogram 05/2024: Conclusion(s)/Recommendation(s): Normal biventricular function without  evidence of hemodynamically significant valvular heart disease. No left  ventricular mural or apical thrombus/thrombi.   Zio monitor 06/2024:   PAC burden 12%  Predominant rhythm is sinus rhythm. Range is 48-166 bpm with average of 78 bpm. No atrial fibrillation, sustained ventricular tachycardia, significant pause, or high degree AV block.  PAC burden 12%.  1 patient triggered event, corresponding to sinus rhythm.   CTA chest aorta 05/2024: 1. No acute abnormality in the chest. 2. Aortic Atherosclerosis (ICD10-I70.0). Mild irregular mixed density plaque in the descending aorta. No penetrating atherosclerotic ulcer or ulcerated plaque. 3. Emphysema (ICD10-J43.9).   Labs 07/2024: Chol 119, TG 91, HDL 38, LDL 63 HbA1C 5.7% Hb 13.9 Cr 1.0, AlKP 124 TSH 1.3  Physical Exam Vitals and nursing note reviewed.  Constitutional:      General: He is not in acute distress. Neck:     Vascular: No JVD.  Cardiovascular:     Rate and Rhythm: Normal rate and regular rhythm.     Pulses:          Dorsalis pedis pulses are 2+ on the right side and 2+ on the left side.       Posterior tibial pulses  are 2+ on the right side and 2+ on the left side.     Heart sounds: Normal heart sounds. No murmur heard. Pulmonary:     Effort: Pulmonary effort is normal.     Breath sounds: Normal breath sounds. No wheezing or rales.  Musculoskeletal:     Right lower leg: No edema.     Left lower leg: No edema.      VISIT DIAGNOSES:   ICD-10-CM   1. Cigarette nicotine  dependence without complication  F17.210     2. PAD (peripheral artery disease)  I73.9 meloxicam  (MOBIC ) 7.5 MG tablet       ALFONSO CARDEN is a 48 y.o. male  with nicotine  dependence, PAD s/p left femoral thrombectomy and endarterectomy (05/2024)  Assessment & Plan Peripheral artery disease of left lower extremity status post angioplasty: Pain is focal in left thigh.  Normal distal pulses cellulitis, vascular) pain.  I suspect he may be having used muscle spasms in his left quadriceps.  Continue monitoring for now.  He will get follow-up ABI through vascular surgery clinic 1 year from his surgery. Continue aspirin  81 mg daily, Lipitor  80 mg daily.  Lipids well-controlled.  Nicotine  dependence,: Tobacco cessation counseling: - Currently smoking 1 packs/day   - Patient was informed of the dangers of tobacco abuse including stroke, cancer, and MI, as well as benefits of tobacco cessation. - Patient is willing to quit at this time. - Approximately 5 mins were spent counseling patient cessation techniques. We discussed various methods to help quit smoking, including deciding on a date to quit, joining a support group, pharmacological agents. Patient would like to use nicotine  patches. - I will reassess his progress at the next follow-up visit  Prediabetes: Borderline prediabetic status. Advised dietary changes to improve glycemic control. - Advised reduction of sugar and simple carbohydrate intake. - Consider intermittent fasting to improve glycemic control.       F/u in 6 months  Signed, Newman JINNY Lawrence, MD

## 2024-11-08 NOTE — Patient Instructions (Signed)
  Follow-Up: At [facility], you and your health needs are our priority.  As part of our continuing mission to provide you with exceptional heart care, our providers are all part of one team.  This team includes your primary Cardiologist (physician) and Advanced Practice Providers or APPs (Physician Assistants and Nurse Practitioners) who all work together to provide you with the care you need, when you need it.  Your next appointment:   6 month(s)  Provider:   One of our Advanced Practice Providers (APPs) or Dr. [place] : Morse Clause, PA-C  Lamarr Satterfield, NP Miriam Shams, NP  Olivia Pavy, PA-C Josefa Beauvais, NP  Leontine Salen, PA-C Orren Fabry, PA-C  [place]mit[place], PA-C Ernest Dick, NP  Damien Braver, NP Jon Hails, PA-C  Waddell Donath, PA-C    Dayna Dunn, PA-C  Yamen Weaver, PA-C Lum Louis, NP Katlyn West, NP Callie Goodrich, PA-C  Xika Zhao, NP Sheng Haley, PA-C    Kathleen Johnson, PA-C

## 2024-11-09 ENCOUNTER — Encounter: Payer: Self-pay | Admitting: Cardiology

## 2024-11-14 ENCOUNTER — Encounter (INDEPENDENT_AMBULATORY_CARE_PROVIDER_SITE_OTHER): Payer: Self-pay | Admitting: *Deleted

## 2024-11-15 ENCOUNTER — Ambulatory Visit (INDEPENDENT_AMBULATORY_CARE_PROVIDER_SITE_OTHER): Admitting: Gastroenterology

## 2024-11-15 ENCOUNTER — Encounter (INDEPENDENT_AMBULATORY_CARE_PROVIDER_SITE_OTHER): Payer: Self-pay | Admitting: Gastroenterology

## 2024-11-15 ENCOUNTER — Other Ambulatory Visit: Payer: Self-pay | Admitting: Nurse Practitioner

## 2024-11-15 VITALS — BP 121/73 | HR 66 | Temp 97.8°F | Ht 70.0 in | Wt 140.4 lb

## 2024-11-15 DIAGNOSIS — K5900 Constipation, unspecified: Secondary | ICD-10-CM | POA: Diagnosis not present

## 2024-11-15 DIAGNOSIS — Z91014 Allergy to mammalian meats: Secondary | ICD-10-CM | POA: Insufficient documentation

## 2024-11-15 DIAGNOSIS — R195 Other fecal abnormalities: Secondary | ICD-10-CM | POA: Diagnosis not present

## 2024-11-15 DIAGNOSIS — R1011 Right upper quadrant pain: Secondary | ICD-10-CM | POA: Diagnosis not present

## 2024-11-15 DIAGNOSIS — R110 Nausea: Secondary | ICD-10-CM | POA: Diagnosis not present

## 2024-11-15 DIAGNOSIS — N529 Male erectile dysfunction, unspecified: Secondary | ICD-10-CM

## 2024-11-15 DIAGNOSIS — Z0001 Encounter for general adult medical examination with abnormal findings: Secondary | ICD-10-CM

## 2024-11-15 DIAGNOSIS — K2100 Gastro-esophageal reflux disease with esophagitis, without bleeding: Secondary | ICD-10-CM

## 2024-11-15 NOTE — Patient Instructions (Signed)
-  schedule colonoscopy and EGD  -Start taking Miralax  2 capfuls daily x2 weeks then decrease to 1 capful per day -start metamucil BID -Increase water intake, aim for atleast 64 oz per day Increase fruits, veggies and whole grains, kiwi and prunes are especially good for constipation -avoid mammal meat, dairy, gelatin capsules -continue omeprazole  20mg  daily (try to get changed to tablet vs. Capsule due to alpha gal)  Follow up 3 months  It was a pleasure to see you today. I want to create trusting relationships with patients and provide genuine, compassionate, and quality care. I truly value your feedback! please be on the lookout for a survey regarding your visit with me today. I appreciate your input about our visit and your time in completing this!    Krishav Mamone L. Minnette Merida, MSN, APRN, AGNP-C Adult-Gerontology Nurse Practitioner [facility] at [facility]

## 2024-11-15 NOTE — Progress Notes (Signed)
 Referring Provider: Deitra Morton Sebastian Thurmon* Primary Care Physician:  Deitra Morton Sebastian Nena, NP Primary GI Physician: New (Dr. Cinderella)   Chief Complaint  Patient presents with   New Patient (Initial Visit)    Patient here today due to a + cologuard. Patient does have some issues with RUQ pain, that has been going on for a week and a half. Patient has a history of Alpha gal.    HPI:   Zachary Rodriguez is a 48 y.o. male with past medical history of abdominal hernia, PAD, elevated LDL cholesterol, limb ischemia requiring left common femoral artery thrombectomy with femoral endarterectomy and bovine patch angioplasty  Patient presenting today for:  Positive cologuard Upper abdominal pain and nausea Constipation  Alpha gal syndrome   Seen back in 2022 for nausea, epigastric pain, constipation.   Recommended zofran  PRN, lipase, CBC, CMP, CT A/P with contrast, stop ibuprofen , continue prevacid, start carafate , schedule colonoscopy at follow up visit after UGI symptoms improved  CT A/P with contrast done 10/2021 No acute abnormality. No evidence of bowel obstruction or acute bowel inflammation. Normal appendix. No evidence of recurrent ventral abdominal hernia.  Recommended EGD for further evaluation though, Patient was lost to follow up   Last labs:  November: Positive cologuard  September 2025 Alpha gal positive TSH 1.36   August: HFP: AST 19 ALT 21 Alk phos 124   June 2025: CBC normal other than WBC 12   Present:  States recent positive cologuard. He endorses constipation. No obvious rectal bleeding or melena.  No obvious hemorrhoids that he is aware of. He takes stool softeners, tries to have a BM daily but recently has missed two days without going. Drinking about 32 oz of water per day. Significant other present reports he has some vasovagal episodes where he will become very diaphoretic when he tries to strain to defecate.   He has intermittent RUQ pain since his  previous hernia surgery. Feels that pain is related to when he is constipated, has some abdominal distention when this occurs. He has some nausea daily this has been ongoing since his previous hernia surgery. Endorses a lot belching recently, took a gas pill which helped some. Taking omeprazole  which seems to keep his acid reflux well controlled.   Recently diagnosed with alpha gal. Trying to avoid meat, but still consuming dairy. He has a gelatin capsule (omeprazole ). Appetite is not great, he has lost a few pounds. Denies any rashes, SOB or swelling with any exposure to alpha gal products.   Had 100% occlusion in femoral artery in June requiring surgical intervention.     Last Colonoscopy: never  Last Endoscopy: never   Past Medical History:  Diagnosis Date   Abdominal hernia    Elevated LDL cholesterol level 03/20/2020   Hyperlipidemia 05/25/24    Past Surgical History:  Procedure Laterality Date   ENDARTERECTOMY FEMORAL Left 05/25/2024   Procedure: LEFT COMMON FEMORAL ENDARTERECTOMY WITH BOVINE PATCH;  Surgeon: Gretta Lonni PARAS, MD;  Location: [facility] OR;  Service: Vascular;  Laterality: Left;   HERNIA REPAIR     MOUTH SURGERY     THROMBECTOMY FEMORAL ARTERY Left 05/25/2024   Procedure: LEFT LOWER EXTRIMITY THROMBECTOMY, ARTERY, FEMORAL;  Surgeon: Gretta Lonni PARAS, MD;  Location: [facility] OR;  Service: Vascular;  Laterality: Left;    Current Outpatient Medications  Medication Sig Dispense Refill   aspirin  EC 81 MG tablet Take 1 tablet (81 mg total) by mouth daily. Swallow whole. 90 tablet 1  atorvastatin  (LIPITOR ) 80 MG tablet Take 1 tablet (80 mg total) by mouth daily. 90 tablet 1   docusate sodium  (COLACE) 100 MG capsule Take 100 mg by mouth 2 (two) times daily.     EPINEPHrine  0.3 mg/0.3 mL IJ SOAJ injection Inject 0.3 mg into the muscle as needed for anaphylaxis. 1 each 1   ketoconazole  (NIZORAL ) 2 % cream Apply 1 Application topically daily. 15 g 0   meloxicam  (MOBIC ) 7.5 MG  tablet Take 7.5 mg by mouth as needed for pain.     omeprazole  (PRILOSEC) 20 MG capsule TAKE 1 CAPSULE BY MOUTH EVERY DAY 90 capsule 0   ondansetron  (ZOFRAN ) 4 MG tablet Take 1 tablet (4 mg total) by mouth every 8 (eight) hours as needed for nausea or vomiting. 30 tablet 1   tadalafil , PAH, (ADCIRCA ) 20 MG tablet TAKE 1 TABLET BY MOUTH EVERY DAY 30 tablet 1   No current facility-administered medications for this visit.    Allergies as of 11/15/2024 - Review Complete 11/15/2024  Allergen Reaction Noted   Alpha-gal  11/08/2024    Social History   Socioeconomic History   Marital status: Married    Spouse name: Not on file   Number of children: Not on file   Years of education: Not on file   Highest education level: 12th grade  Occupational History   Not on file  Tobacco Use   Smoking status: Every Day    Average packs/day: 1.5 packs/day for 28.4 years (42.7 ttl pk-yrs)    Types: Cigarettes    Start date: 1997   Smokeless tobacco: Never  Vaping Use   Vaping status: Never Used  Substance and Sexual Activity   Alcohol use: No   Drug use: Yes    Types: Marijuana    Comment: 03/19/20- states no drugs    Sexual activity: Not on file  Other Topics Concern   Not on file  Social History Narrative   Not on file   Social Drivers of Health   Financial Resource Strain: Medium Risk (09/11/2024)   Overall Financial Resource Strain (CARDIA)    Difficulty of Paying Living Expenses: Somewhat hard  Food Insecurity: No Food Insecurity (09/11/2024)   Hunger Vital Sign    Worried About Running Out of Food in the Last Year: Never true    Ran Out of Food in the Last Year: Never true  Transportation Needs: No Transportation Needs (09/11/2024)   PRAPARE - Administrator, Civil Service (Medical): No    Lack of Transportation (Non-Medical): No  Physical Activity: Not on file  Stress: Not on file  Social Connections: Moderately Isolated (09/11/2024)   Social Connection and Isolation  Panel    Frequency of Communication with Friends and Family: Twice a week    Frequency of Social Gatherings with Friends and Family: Once a week    Attends Religious Services: Never    Database Administrator or Organizations: No    Attends Engineer, Structural: Not on file    Marital Status: Married    Review of systems General: negative for malaise, night sweats, fever, chills, weight loss Neck: Negative for lumps, goiter, pain and significant neck swelling Resp: Negative for cough, wheezing, dyspnea at rest CV: Negative for chest pain, leg swelling, palpitations, orthopnea GI: denies melena, hematochezia,  vomiting, diarrhea, dysphagia, odyonophagia, early satiety or unintentional weight loss. +upper abdominal pain +nausea +constipation  MSK: Negative for joint pain or swelling, back pain, and muscle pain. Derm:  Negative for itching or rash Psych: Denies depression, anxiety, memory loss, confusion. No homicidal or suicidal ideation.  Heme: Negative for prolonged bleeding, bruising easily, and swollen nodes. Endocrine: Negative for cold or heat intolerance, polyuria, polydipsia and goiter. Neuro: negative for tremor, gait imbalance, syncope and seizures. The remainder of the review of systems is noncontributory.  Physical Exam: BP 121/73 (BP Location: Right Arm, Patient Position: Sitting, Cuff Size: Normal)   Pulse 66   Temp 97.8 F (36.6 C) (Temporal)   Ht 5' 10 (1.778 m)   Wt 140 lb 6.4 oz (63.7 kg)   BMI 20.15 kg/m  General:   Alert and oriented. No distress noted. Pleasant and cooperative.  Head:  Normocephalic and atraumatic. Eyes:  Conjuctiva clear without scleral icterus. Mouth:  Oral mucosa pink and moist. Good dentition. No lesions. Heart: Normal rate and rhythm, s1 and s2 heart sounds present.  Lungs: Clear lung sounds in all lobes. Respirations equal and unlabored. Abdomen:  +BS, soft, and non-distended. TTP of epigastric and RUQ. No rebound or guarding. No  HSM or masses noted. Derm: No palmar erythema or jaundice Msk:  Symmetrical without gross deformities. Normal posture. Extremities:  Without edema. Neurologic:  Alert and  oriented x4 Psych:  Alert and cooperative. Normal mood and affect.  Invalid input(s): 6 MONTHS   ASSESSMENT: Zachary Rodriguez is a 48 y.o. male presenting today for Positive cologuard, Upper abdominal pain and nausea, Constipation, Alpha gal syndrome   Positive cologuard: recently positive cologuard with PCP. No overt rectal bleeding or melena. We discussed indications of positive cologuard with recommendations for further evaluation via colonoscopy.   Upper abdominal pain and nausea: ongoing since previous hernia surgery, has had cross sectional imaging without findings to explain pain. Previously recommended EGD but was lost to follow up. Does take meloxicam , cannot rule out gastritis, duodenitis, PUD. Recommend EGD at time of colonoscopy.  Indications, risks and benefits of procedure discussed in detail with patient. Patient verbalized understanding and is in agreement to proceed with EGD and Colonoscopy.   Constipation: chronic since previous hernia repair. Taking stool softner but still having days without BMs, having to strain some. Encouraged increasing his water intake, fruits, veggies, whole grains. Will start metamucil and miralax .   Alpha gal syndrome: previously diagnosed by PCP. Still consuming some dairy, he is unsure if this elicits any symptoms, taking some capsule medications. Trying to avoid beef/pork/lamb. Patient counseled on possible issues with dairy given alpha gal allergy as well as potential for gelatin pill capsules to sometimes causes issues for some patients. Encouraged to avoid gelatin capsules and hold off on dairy for the next few weeks to see how he feels.    PLAN:  -schedule colonoscopy and EGD  -Start taking Miralax  1 capful every day for one week. If bowel movements do not improve,  increase to 1 capful every 12 hours. If after two weeks there is no improvement, increase to 1 capful every 8 hours -avoid mammal meat, dairy, gelatin capsules -continue omeprazole  20mg  daily   All questions were answered, patient verbalized understanding and is in agreement with plan as outlined above.   Follow Up: 3 months   Zachary Rodriguez L. Daizy Outen, MSN, APRN, AGNP-C Adult-Gerontology Nurse Practitioner [facility]

## 2024-11-16 NOTE — Progress Notes (Unsigned)
 Subjective:  Patient ID: Zachary Rodriguez, male    DOB: Aug 07, 1976, 48 y.o.   MRN: [id number]  Patient Care Team: Deitra Morton Hummer, Nena, NP as PCP - General (Nurse Practitioner) Elmira Newman PARAS, MD as PCP - Cardiology (Cardiology)   Chief Complaint:  No chief complaint on file.   HPI: Zachary Rodriguez is a 48 y.o. male presenting on 11/21/2024 for No chief complaint on file.   Discussed the use of AI scribe software for clinical note transcription with the patient, who gave verbal consent to proceed.  History of Present Illness       Relevant past medical, surgical, family, and social history reviewed and updated as indicated.  Allergies and medications reviewed and updated. Data reviewed: Chart in Epic.   Past Medical History:  Diagnosis Date   Abdominal hernia    Elevated LDL cholesterol level 03/20/2020   Hyperlipidemia 05/25/24    Past Surgical History:  Procedure Laterality Date   ENDARTERECTOMY FEMORAL Left 05/25/2024   Procedure: LEFT COMMON FEMORAL ENDARTERECTOMY WITH BOVINE PATCH;  Surgeon: Gretta Lonni PARAS, MD;  Location: [facility] OR;  Service: Vascular;  Laterality: Left;   HERNIA REPAIR     MOUTH SURGERY     THROMBECTOMY FEMORAL ARTERY Left 05/25/2024   Procedure: LEFT LOWER EXTRIMITY THROMBECTOMY, ARTERY, FEMORAL;  Surgeon: Gretta Lonni PARAS, MD;  Location: [facility] OR;  Service: Vascular;  Laterality: Left;    Social History   Socioeconomic History   Marital status: Married    Spouse name: Not on file   Number of children: Not on file   Years of education: Not on file   Highest education level: 12th grade  Occupational History   Not on file  Tobacco Use   Smoking status: Every Day    Average packs/day: 1.5 packs/day for 28.4 years (42.7 ttl pk-yrs)    Types: Cigarettes    Start date: 1997   Smokeless tobacco: Never  Vaping Use   Vaping status: Never Used  Substance and Sexual Activity   Alcohol use: No   Drug use: Yes    Types: Marijuana     Comment: 03/19/20- states no drugs    Sexual activity: Not on file  Other Topics Concern   Not on file  Social History Narrative   Not on file   Social Drivers of Health   Financial Resource Strain: Medium Risk (09/11/2024)   Overall Financial Resource Strain (CARDIA)    Difficulty of Paying Living Expenses: Somewhat hard  Food Insecurity: No Food Insecurity (09/11/2024)   Hunger Vital Sign    Worried About Running Out of Food in the Last Year: Never true    Ran Out of Food in the Last Year: Never true  Transportation Needs: No Transportation Needs (09/11/2024)   PRAPARE - Administrator, Civil Service (Medical): No    Lack of Transportation (Non-Medical): No  Physical Activity: Not on file  Stress: Not on file  Social Connections: Moderately Isolated (09/11/2024)   Social Connection and Isolation Panel    Frequency of Communication with Friends and Family: Twice a week    Frequency of Social Gatherings with Friends and Family: Once a week    Attends Religious Services: Never    Database Administrator or Organizations: No    Attends Banker Meetings: Not on file    Marital Status: Married  Intimate Partner Violence: Not At Risk (05/24/2024)   Humiliation, Afraid, Rape, and Kick questionnaire  Fear of Current or Ex-Partner: No    Emotionally Abused: No    Physically Abused: No    Sexually Abused: No    Outpatient Encounter Medications as of 11/21/2024  Medication Sig   aspirin  EC 81 MG tablet Take 1 tablet (81 mg total) by mouth daily. Swallow whole.   atorvastatin  (LIPITOR ) 80 MG tablet Take 1 tablet (80 mg total) by mouth daily.   docusate sodium  (COLACE) 100 MG capsule Take 100 mg by mouth 2 (two) times daily.   EPINEPHrine  0.3 mg/0.3 mL IJ SOAJ injection Inject 0.3 mg into the muscle as needed for anaphylaxis.   ketoconazole  (NIZORAL ) 2 % cream Apply 1 Application topically daily.   meloxicam  (MOBIC ) 7.5 MG tablet Take 7.5 mg by mouth as needed  for pain.   omeprazole  (PRILOSEC) 20 MG capsule TAKE 1 CAPSULE BY MOUTH EVERY DAY   ondansetron  (ZOFRAN ) 4 MG tablet Take 1 tablet (4 mg total) by mouth every 8 (eight) hours as needed for nausea or vomiting.   tadalafil , PAH, (ADCIRCA ) 20 MG tablet TAKE 1 TABLET BY MOUTH EVERY DAY   No facility-administered encounter medications on file as of 11/21/2024.    Allergies  Allergen Reactions   Alpha-Gal     Pertinent ROS per HPI, otherwise unremarkable      Objective:  There were no vitals taken for this visit.   Wt Readings from Last 3 Encounters:  11/15/24 140 lb 6.4 oz (63.7 kg)  11/08/24 140 lb (63.5 kg)  10/24/24 143 lb 9.6 oz (65.1 kg)    Physical Exam Physical Exam      Results for orders placed or performed in visit on 10/24/24  Cologuard   Collection Time: 10/31/24  9:00 AM  Result Value Ref Range   COLOGUARD Positive (A) Negative       Pertinent labs & imaging results that were available during my care of the patient were reviewed by me and considered in my medical decision making.  Assessment & Plan:  There are no diagnoses linked to this encounter.   Assessment and Plan Assessment & Plan       Continue all other maintenance medications.  Follow up plan: No follow-ups on file.   Continue healthy lifestyle choices, including diet (rich in fruits, vegetables, and lean proteins, and low in salt and simple carbohydrates) and exercise (at least 30 minutes of moderate physical activity daily).  Educational handout given for ***  The above assessment and management plan was discussed with the patient. The patient verbalized understanding of and has agreed to the management plan. Patient is aware to call the clinic if they develop any new symptoms or if symptoms persist or worsen. Patient is aware when to return to the clinic for a follow-up visit. Patient educated on when it is appropriate to go to the emergency department.   Saqib Cazarez [place] Thompson,  DNP [facility] [address] [place], [place] [address] [phone]

## 2024-11-21 ENCOUNTER — Ambulatory Visit: Admitting: Nurse Practitioner

## 2024-11-21 VITALS — BP 113/58 | HR 79 | Temp 98.9°F | Ht 70.0 in | Wt 142.0 lb

## 2024-11-21 DIAGNOSIS — K2100 Gastro-esophageal reflux disease with esophagitis, without bleeding: Secondary | ICD-10-CM

## 2024-11-21 DIAGNOSIS — M545 Low back pain, unspecified: Secondary | ICD-10-CM

## 2024-11-21 DIAGNOSIS — M79605 Pain in left leg: Secondary | ICD-10-CM

## 2024-11-21 DIAGNOSIS — E782 Mixed hyperlipidemia: Secondary | ICD-10-CM

## 2024-11-21 DIAGNOSIS — N529 Male erectile dysfunction, unspecified: Secondary | ICD-10-CM

## 2024-11-21 MED ORDER — MELOXICAM 7.5 MG PO TABS: 7.5000 mg | ORAL_TABLET | ORAL | 0 refills | Status: AC | PRN

## 2024-11-21 MED ORDER — ATORVASTATIN CALCIUM 80 MG PO TABS: 80.0000 mg | ORAL_TABLET | Freq: Every day | ORAL | 1 refills | Status: AC

## 2024-11-21 MED ORDER — OMEPRAZOLE 20 MG PO CPDR: 20.0000 mg | DELAYED_RELEASE_CAPSULE | Freq: Every day | ORAL | 3 refills | Status: AC

## 2024-12-05 ENCOUNTER — Telehealth: Payer: Self-pay | Admitting: *Deleted

## 2024-12-05 NOTE — Telephone Encounter (Signed)
 LMOVM to call back to schedule TCS/EGD with Dr. Tasia Catchings, ASA 2

## 2024-12-12 ENCOUNTER — Telehealth: Payer: Self-pay | Admitting: Gastroenterology

## 2024-12-12 NOTE — Telephone Encounter (Signed)
 Pt called to schedule procedure. Advised pt that will have to call him back once we get providers schedule for Feb. If any cancellations will call to get him scheduled. Pt verbalized understanding.

## 2024-12-12 NOTE — Telephone Encounter (Signed)
 Spoke to pt's wife Leita (on dpr) and informed her that could schedule pt for next Tuesday 12/20/24 but provider has a block on the schedule that will come off tomorrow. I can't put him in until the actual block comes off tomorrow morning. I will give her a call tomorrow morning and finalize everything.  Spouse verbalized understanding.

## 2024-12-12 NOTE — Telephone Encounter (Signed)
 I think it is first most appropriate to see if [facility], where he has already been seen in consultation and scheduled for upper and lower endoscopy, can accommodate these procedures sooner than February. I am including the division programmer, multimedia on this replied to try to facilitate an earlier appointment for this patient JMP

## 2024-12-12 NOTE — Telephone Encounter (Signed)
 Good afternoon Dr. Albertus,    DOD of the PM,    I received a call from this patient wife stating that her husband was referred to a GI practice due to him having a positive colo guard test. Patient wife stated that after they had the consultation they were not scheduled for a colonoscopy so they called the office to schedule one but was advised that they did not have availability until February. Wife stated that she would like a sooner colonoscopy appointment with her husband due to the positive colo guard. Would you please advise on scheduling this patient.    Thank you.

## 2024-12-13 ENCOUNTER — Encounter: Payer: Self-pay | Admitting: *Deleted

## 2024-12-13 ENCOUNTER — Other Ambulatory Visit: Payer: Self-pay | Admitting: *Deleted

## 2024-12-13 MED ORDER — PEG 3350-KCL-NA BICARB-NACL 420 G PO SOLR: 4000.0000 mL | Freq: Once | ORAL | 0 refills | Status: AC

## 2024-12-13 NOTE — Telephone Encounter (Signed)
 Reba Thanks so much! Can you please let the patient know this plan? JMP

## 2024-12-20 ENCOUNTER — Encounter (INDEPENDENT_AMBULATORY_CARE_PROVIDER_SITE_OTHER): Payer: Self-pay | Admitting: *Deleted

## 2024-12-20 ENCOUNTER — Other Ambulatory Visit: Payer: Self-pay

## 2024-12-20 ENCOUNTER — Encounter (HOSPITAL_COMMUNITY): Admission: RE | Disposition: A | Payer: Self-pay | Source: Home / Self Care | Attending: Gastroenterology

## 2024-12-20 ENCOUNTER — Ambulatory Visit (HOSPITAL_COMMUNITY)
Admission: RE | Admit: 2024-12-20 | Discharge: 2024-12-20 | Disposition: A | Discharge status: Home / Self Care | Attending: Gastroenterology | Admitting: Gastroenterology

## 2024-12-20 ENCOUNTER — Ambulatory Visit (HOSPITAL_COMMUNITY): Admitting: Anesthesiology

## 2024-12-20 ENCOUNTER — Encounter (HOSPITAL_COMMUNITY): Payer: Self-pay | Admitting: Gastroenterology

## 2024-12-20 DIAGNOSIS — K5730 Diverticulosis of large intestine without perforation or abscess without bleeding: Secondary | ICD-10-CM | POA: Insufficient documentation

## 2024-12-20 DIAGNOSIS — I739 Peripheral vascular disease, unspecified: Secondary | ICD-10-CM | POA: Insufficient documentation

## 2024-12-20 DIAGNOSIS — K3189 Other diseases of stomach and duodenum: Secondary | ICD-10-CM | POA: Insufficient documentation

## 2024-12-20 DIAGNOSIS — R195 Other fecal abnormalities: Secondary | ICD-10-CM | POA: Insufficient documentation

## 2024-12-20 DIAGNOSIS — K449 Diaphragmatic hernia without obstruction or gangrene: Secondary | ICD-10-CM | POA: Insufficient documentation

## 2024-12-20 DIAGNOSIS — D122 Benign neoplasm of ascending colon: Secondary | ICD-10-CM | POA: Insufficient documentation

## 2024-12-20 DIAGNOSIS — D125 Benign neoplasm of sigmoid colon: Secondary | ICD-10-CM | POA: Diagnosis not present

## 2024-12-20 DIAGNOSIS — K2950 Unspecified chronic gastritis without bleeding: Secondary | ICD-10-CM | POA: Insufficient documentation

## 2024-12-20 DIAGNOSIS — D123 Benign neoplasm of transverse colon: Secondary | ICD-10-CM | POA: Diagnosis not present

## 2024-12-20 DIAGNOSIS — Z1211 Encounter for screening for malignant neoplasm of colon: Secondary | ICD-10-CM | POA: Diagnosis present

## 2024-12-20 DIAGNOSIS — K621 Rectal polyp: Secondary | ICD-10-CM | POA: Diagnosis not present

## 2024-12-20 DIAGNOSIS — K648 Other hemorrhoids: Secondary | ICD-10-CM | POA: Insufficient documentation

## 2024-12-20 DIAGNOSIS — F17210 Nicotine dependence, cigarettes, uncomplicated: Secondary | ICD-10-CM | POA: Insufficient documentation

## 2024-12-20 DIAGNOSIS — K2970 Gastritis, unspecified, without bleeding: Secondary | ICD-10-CM

## 2024-12-20 DIAGNOSIS — I10 Essential (primary) hypertension: Secondary | ICD-10-CM | POA: Diagnosis not present

## 2024-12-20 DIAGNOSIS — K635 Polyp of colon: Secondary | ICD-10-CM

## 2024-12-20 DIAGNOSIS — E785 Hyperlipidemia, unspecified: Secondary | ICD-10-CM | POA: Insufficient documentation

## 2024-12-20 DIAGNOSIS — R1013 Epigastric pain: Secondary | ICD-10-CM | POA: Insufficient documentation

## 2024-12-20 HISTORY — PX: ESOPHAGOGASTRODUODENOSCOPY: SHX5428

## 2024-12-20 HISTORY — DX: Personal history of other venous thrombosis and embolism: Z86.718

## 2024-12-20 HISTORY — PX: POLYPECTOMY: SHX149

## 2024-12-20 HISTORY — DX: Personal history of other diseases of the digestive system: Z87.19

## 2024-12-20 HISTORY — PX: COLONOSCOPY: SHX5424

## 2024-12-20 LAB — HM COLONOSCOPY

## 2024-12-20 SURGERY — COLONOSCOPY
Anesthesia: Monitor Anesthesia Care

## 2024-12-20 MED ORDER — LIDOCAINE 2% (20 MG/ML) 5 ML SYRINGE
INTRAMUSCULAR | Status: DC | PRN
  Administered 2024-12-20: 100 mg via INTRAVENOUS

## 2024-12-20 MED ORDER — LACTATED RINGERS IV SOLN: INTRAVENOUS | Status: DC | PRN

## 2024-12-20 MED ORDER — EPHEDRINE SULFATE-NACL 50-0.9 MG/10ML-% IV SOSY
PREFILLED_SYRINGE | INTRAVENOUS | Status: DC | PRN
  Administered 2024-12-20 (×4): 5 mg via INTRAVENOUS

## 2024-12-20 MED ORDER — PROPOFOL 500 MG/50ML IV EMUL
INTRAVENOUS | Status: DC | PRN
  Administered 2024-12-20: 200 ug/kg/min via INTRAVENOUS
  Administered 2024-12-20: 100 mg via INTRAVENOUS

## 2024-12-20 MED ORDER — PHENYLEPHRINE HCL-NACL 20-0.9 MG/250ML-% IV SOLN
INTRAVENOUS | Status: DC | PRN
  Administered 2024-12-20: 240 ug via INTRAVENOUS
  Administered 2024-12-20 (×3): 80 ug via INTRAVENOUS

## 2024-12-20 NOTE — Anesthesia Preprocedure Evaluation (Signed)
"                                    Anesthesia Evaluation  Patient identified by MRN, date of birth, ID band Patient awake    Reviewed: Allergy & Precautions, H&P , NPO status , Patient's Chart, lab work & pertinent test results, reviewed documented beta blocker date and time   Airway Mallampati: II  TM Distance: >3 FB Neck ROM: full    Dental no notable dental hx.    Pulmonary neg pulmonary ROS, Current Smoker and Patient abstained from smoking.   Pulmonary exam normal breath sounds clear to auscultation       Cardiovascular Exercise Tolerance: Good hypertension, + Peripheral Vascular Disease   Rhythm:regular Rate:Normal     Neuro/Psych  PSYCHIATRIC DISORDERS Anxiety Depression    negative neurological ROS     GI/Hepatic Neg liver ROS, hiatal hernia,,,  Endo/Other  negative endocrine ROS    Renal/GU negative Renal ROS  negative genitourinary   Musculoskeletal   Abdominal   Peds  Hematology negative hematology ROS (+)   Anesthesia Other Findings   Reproductive/Obstetrics negative OB ROS                              Anesthesia Physical Anesthesia Plan  ASA: 2  Anesthesia Plan: MAC   Post-op Pain Management:    Induction:   PONV Risk Score and Plan: Propofol  infusion  Airway Management Planned:   Additional Equipment:   Intra-op Plan:   Post-operative Plan:   Informed Consent: I have reviewed the patients History and Physical, chart, labs and discussed the procedure including the risks, benefits and alternatives for the proposed anesthesia with the patient or authorized representative who has indicated his/her understanding and acceptance.     Dental Advisory Given  Plan Discussed with: CRNA  Anesthesia Plan Comments:         Anesthesia Quick Evaluation  "

## 2024-12-20 NOTE — Op Note (Signed)
 [facility] Patient Name: Zachary Rodriguez Procedure Date: 12/20/2024 7:07 AM MRN: [id number] Date of Birth: 1976/09/28 Attending MD: Deatrice Dine , MD, [id number] CSN: [id number] Age: 49 Admit Type: Outpatient Procedure:                Upper GI endoscopy Indications:              Epigastric abdominal pain, Abdominal pain in the                            right upper quadrant Providers:                Deatrice Dine, MD, Jon LABOR. Gerome RN, RN,                            Dorcas Lenis, Technician Referring MD:              Medicines:                Monitored Anesthesia Care Complications:            No immediate complications. Estimated Blood Loss:     Estimated blood loss was minimal. Procedure:                Pre-Anesthesia Assessment:                           - Prior to the procedure, a History and Physical                            was performed, and patient medications and                            allergies were reviewed. The patient's tolerance of                            previous anesthesia was also reviewed. The risks                            and benefits of the procedure and the sedation                            options and risks were discussed with the patient.                            All questions were answered, and informed consent                            was obtained. Prior Anticoagulants: The patient has                            taken no anticoagulant or antiplatelet agents. ASA                            Grade Assessment: III - A patient with severe  systemic disease. After reviewing the risks and                            benefits, the patient was deemed in satisfactory                            condition to undergo the procedure.                           After obtaining informed consent, the endoscope was                            passed under direct vision. Throughout the                            procedure, the  patient's blood pressure, pulse, and                            oxygen saturations were monitored continuously. The                            [id number] ([id number]) Upper was introduced through                            the mouth, and advanced to the second part of                            duodenum. The upper GI endoscopy was accomplished                            without difficulty. The patient tolerated the                            procedure well. Scope In: 7:48:58 AM Scope Out: 7:54:01 AM Total Procedure Duration: 0 hours 5 minutes 3 seconds  Findings:      The examined esophagus was normal.      Mildly erythematous mucosa without bleeding was found in the gastric       antrum. Biopsies were taken with a cold forceps for histology.      The duodenal bulb and second portion of the duodenum were normal.       Biopsies were taken with a cold forceps for histology.      A 3 cm hiatal hernia was present. Impression:               - Normal esophagus.                           - Erythematous mucosa in the antrum. Biopsied.                           - Normal duodenal bulb and second portion of the                            duodenum. Biopsied.                           -  3 cm hiatal hernia. Moderate Sedation:      Per Anesthesia Care Recommendation:           - Patient has a contact number available for                            emergencies. The signs and symptoms of potential                            delayed complications were discussed with the                            patient. Return to normal activities tomorrow.                            Written discharge instructions were provided to the                            patient.                           - Resume previous diet.                           - Continue present medications.                           - Await pathology results.                           -If persistent pain, evaluate for gallbladder                             disease Procedure Code(s):        --- Professional ---                           [phone], Esophagogastroduodenoscopy, flexible,                            transoral; with biopsy, single or multiple Diagnosis Code(s):        --- Professional ---                           K31.89, Other diseases of stomach and duodenum                           K44.9, Diaphragmatic hernia without obstruction or                            gangrene                           R10.13, Epigastric pain                           R10.11, Right upper quadrant pain CPT copyright 2022 American Medical Association. All rights reserved. The codes documented in this report  are preliminary and upon coder review may  be revised to meet current compliance requirements. Deatrice Dine, MD Deatrice Dine, MD 12/20/2024 8:46:48 AM This report has been signed electronically. Number of Addenda: 0

## 2024-12-20 NOTE — Op Note (Signed)
 [facility] Patient Name: Zachary Rodriguez Procedure Date: 12/20/2024 7:06 AM MRN: [id number] Date of Birth: 27-Sep-1976 Attending MD: Deatrice Dine , MD, [id number] CSN: [id number] Age: 49 Admit Type: Outpatient Procedure:                Colonoscopy Indications:              Positive Cologuard test Providers:                Deatrice Dine, MD, Jon LABOR. Gerome RN, RN,                            Dorcas Lenis, Technician Referring MD:              Medicines:                Propofol  per Anesthesia, Monitored Anesthesia Care Complications:            No immediate complications. Estimated Blood Loss:     Estimated blood loss was minimal. Procedure:                Pre-Anesthesia Assessment:                           - Prior to the procedure, a History and Physical                            was performed, and patient medications and                            allergies were reviewed. The patient's tolerance of                            previous anesthesia was also reviewed. The risks                            and benefits of the procedure and the sedation                            options and risks were discussed with the patient.                            All questions were answered, and informed consent                            was obtained. Prior Anticoagulants: The patient has                            taken no anticoagulant or antiplatelet agents. ASA                            Grade Assessment: III - A patient with severe                            systemic disease. After reviewing the risks and  benefits, the patient was deemed in satisfactory                            condition to undergo the procedure.                           After obtaining informed consent, the colonoscope                            was passed under direct vision. Throughout the                            procedure, the patient's blood pressure, pulse, and                             oxygen saturations were monitored continuously. The                            [id number] ([id number]) Colon was introduced through                            the anus and advanced to the the cecum, identified                            by appendiceal orifice and ileocecal valve. The                            colonoscopy was performed without difficulty. The                            patient tolerated the procedure well. The quality                            of the bowel preparation was evaluated using the                            BBPS [facility] Bowel Preparation Scale) with scores                            of: Right Colon = 2 (minor amount of residual                            staining, small fragments of stool and/or opaque                            liquid, but mucosa seen well), Transverse Colon = 2                            (minor amount of residual staining, small fragments                            of stool and/or opaque liquid, but mucosa seen  well) and Left Colon = 2 (minor amount of residual                            staining, small fragments of stool and/or opaque                            liquid, but mucosa seen well). The total BBPS score                            equals 6. The ileocecal valve, appendiceal orifice,                            and rectum were photographed. Scope In: 8:00:44 AM Scope Out: 8:34:17 AM Scope Withdrawal Time: 0 hours 31 minutes 27 seconds  Total Procedure Duration: 0 hours 33 minutes 33 seconds  Findings:      The perianal and digital rectal examinations were normal.      Five sessile polyps were found in the sigmoid colon, transverse colon,       hepatic flexure and ascending colon. The polyps were 4 to 6 mm in size.       These polyps were removed with a cold snare. Resection and retrieval       were complete.      A 10 mm polyp was found in the sigmoid colon. The polyp was       pedunculated. The polyp  was removed with a cold snare. Resection and       retrieval were complete.      Five sessile polyps were found in the rectum. The polyps were 2 to 5 mm       in size. These polyps were removed with a cold snare. Resection and       retrieval were complete.      A few diverticula were found in the left colon.      Non-bleeding internal hemorrhoids were found during retroflexion. The       hemorrhoids were small. Impression:               - Five 4 to 6 mm polyps in the sigmoid colon, in                            the transverse colon, at the hepatic flexure and in                            the ascending colon, removed with a cold snare.                            Resected and retrieved.                           - One 10 mm polyp in the sigmoid colon, removed                            with a cold snare. Resected and retrieved.                           -  Five 2 to 5 mm polyps in the rectum, removed with                            a cold snare. Resected and retrieved.                           - Diverticulosis in the left colon.                           - Non-bleeding internal hemorrhoids. Moderate Sedation:      Per Anesthesia Care Recommendation:           - Patient has a contact number available for                            emergencies. The signs and symptoms of potential                            delayed complications were discussed with the                            patient. Return to normal activities tomorrow.                            Written discharge instructions were provided to the                            patient.                           - Resume previous diet.                           - Continue present medications.                           - Await pathology results.                           - Repeat colonoscopy in 1-3 years for surveillance                            based on pathology results.                           - Return to GI clinic as previously  scheduled. Procedure Code(s):        --- Professional ---                           [phone], Colonoscopy, flexible; with removal of                            tumor(s), polyp(s), or other lesion(s) by snare                            technique Diagnosis Code(s):        ---  Professional ---                           D12.5, Benign neoplasm of sigmoid colon                           D12.3, Benign neoplasm of transverse colon (hepatic                            flexure or splenic flexure)                           D12.2, Benign neoplasm of ascending colon                           D12.8, Benign neoplasm of rectum                           K64.8, Other hemorrhoids                           R19.5, Other fecal abnormalities                           K57.30, Diverticulosis of large intestine without                            perforation or abscess without bleeding CPT copyright 2022 American Medical Association. All rights reserved. The codes documented in this report are preliminary and upon coder review may  be revised to meet current compliance requirements. Deatrice Dine, MD Deatrice Dine, MD 12/20/2024 8:49:49 AM This report has been signed electronically. Number of Addenda: 0

## 2024-12-20 NOTE — H&P (Signed)
 Primary Care Physician:  Deitra Morton Sebastian Nena, NP Primary Gastroenterologist:  Dr. Cinderella  Pre-Procedure History & Physical: HPI:  Zachary Rodriguez is a 49 y.o. male with past medical history of abdominal hernia, PAD, elevated LDL cholesterol, limb ischemia requiring left common femoral artery thrombectomy with femoral endarterectomy and bovine patch angioplasty here for postive cologuard, upper abdominal pain,nausea   Past Medical History:  Diagnosis Date   Abdominal hernia    Elevated LDL cholesterol level 03/20/2020   H/O blood clots    History of hiatal hernia    Hyperlipidemia 05/25/24    Past Surgical History:  Procedure Laterality Date   ENDARTERECTOMY FEMORAL Left 05/25/2024   Procedure: LEFT COMMON FEMORAL ENDARTERECTOMY WITH BOVINE PATCH;  Surgeon: Gretta Lonni PARAS, MD;  Location: [facility] OR;  Service: Vascular;  Laterality: Left;   HERNIA REPAIR     MOUTH SURGERY     THROMBECTOMY FEMORAL ARTERY Left 05/25/2024   Procedure: LEFT LOWER EXTRIMITY THROMBECTOMY, ARTERY, FEMORAL;  Surgeon: Gretta Lonni PARAS, MD;  Location: [facility] OR;  Service: Vascular;  Laterality: Left;    Prior to Admission medications  Medication Sig Start Date End Date Taking? Authorizing Provider  aspirin  EC 81 MG tablet Take 1 tablet (81 mg total) by mouth daily. Swallow whole. 05/27/24  Yes Franchot Novel, MD  atorvastatin  (LIPITOR ) 80 MG tablet Take 1 tablet (80 mg total) by mouth daily. 11/21/24  Yes [facility] Sebastian, Nena, NP  docusate sodium  (COLACE) 100 MG capsule Take 100 mg by mouth 2 (two) times daily.   Yes [provider]  ketoconazole  (NIZORAL ) 2 % cream Apply 1 Application topically daily. 10/24/24  Yes [facility] Sebastian, Nena, NP  meloxicam  (MOBIC ) 7.5 MG tablet Take 1 tablet (7.5 mg total) by mouth as needed for pain. 11/21/24  Yes [facility] Sebastian, Nena, NP  omeprazole  (PRILOSEC) 20 MG capsule Take 1 capsule (20 mg total) by mouth daily. 11/21/24  Yes [facility] Sebastian, Nena,  NP  ondansetron  (ZOFRAN ) 4 MG tablet Take 1 tablet (4 mg total) by mouth every 8 (eight) hours as needed for nausea or vomiting. 09/12/24  Yes [facility] Sebastian, Nena, NP  tadalafil , PAH, (ADCIRCA ) 20 MG tablet TAKE 1 TABLET BY MOUTH EVERY DAY 11/15/24  Yes [facility] Sebastian, Nena, NP  EPINEPHrine  0.3 mg/0.3 mL IJ SOAJ injection Inject 0.3 mg into the muscle as needed for anaphylaxis. 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    Family History  Problem Relation Age of Onset   COPD Paternal Grandfather    Emphysema Paternal Grandfather     Social History   Socioeconomic History   Marital status: Married    Spouse name: Not on file   Number of children: Not on file   Years of education: Not on file   Highest education level: 12th grade  Occupational History   Not on file  Tobacco Use   Smoking status: Every Day    Average packs/day: 1.5 packs/day for 28.4 years (42.7 ttl pk-yrs)    Types: Cigarettes    Start date: 1997   Smokeless tobacco: Never  Vaping Use   Vaping status: Never Used  Substance and Sexual Activity   Alcohol use: No   Drug use: Yes    Types: Marijuana    Comment: 03/19/20- states no drugs    Sexual activity: Not on file  Other  Topics Concern   Not on file  Social History Narrative   Not on file   Social Drivers of Health   Tobacco Use: High Risk (12/20/2024)   Patient History    Smoking Tobacco Use: Every Day    Smokeless Tobacco Use: Never    Passive Exposure: Not on file  Financial Resource Strain: Medium Risk (11/21/2024)   Overall Financial Resource Strain (CARDIA)    Difficulty of Paying Living Expenses: Somewhat hard  Food Insecurity: No Food Insecurity (11/21/2024)   Epic    Worried About Radiation Protection Practitioner of Food in the Last Year: Never true    Ran Out of Food in the Last Year: Never true  Transportation Needs: No Transportation Needs  (11/21/2024)   Epic    Lack of Transportation (Medical): No    Lack of Transportation (Non-Medical): No  Physical Activity: Sufficiently Active (11/21/2024)   Exercise Vital Sign    Days of Exercise per Week: 5 days    Minutes of Exercise per Session: 120 min  Stress: Stress Concern Present (11/21/2024)   [employer] of Occupational Health - Occupational Stress Questionnaire    Feeling of Stress: Rather much  Social Connections: Moderately Isolated (11/21/2024)   Social Connection and Isolation Panel    Frequency of Communication with Friends and Family: Twice a week    Frequency of Social Gatherings with Friends and Family: Once a week    Attends Religious Services: Never    Database Administrator or Organizations: No    Attends Engineer, Structural: Not on file    Marital Status: Married  Catering Manager Violence: Not At Risk (05/24/2024)   Humiliation, Afraid, Rape, and Kick questionnaire    Fear of Current or Ex-Partner: No    Emotionally Abused: No    Physically Abused: No    Sexually Abused: No  Depression (PHQ2-9): Medium Risk (11/21/2024)   Depression (PHQ2-9)    PHQ-2 Score: 5  Alcohol Screen: Low Risk (11/21/2024)   Alcohol Screen    Last Alcohol Screening Score (AUDIT): 1  Housing: Low Risk (11/21/2024)   Epic    Unable to Pay for Housing in the Last Year: No    Number of Times Moved in the Last Year: 0    Homeless in the Last Year: No  Utilities: Not At Risk (05/24/2024)   [facility] Utilities    Threatened with loss of utilities: No  Health Literacy: Not on file    Review of Systems: See HPI, otherwise negative ROS  Physical Exam: Vital signs in last 24 hours: Temp:  [98.9 F (37.2 C)] 98.9 F (37.2 C) (01/06 0648) Pulse Rate:  [69] 69 (01/06 0648) Resp:  [14] 14 (01/06 0648) BP: (147)/(93) 147/93 (01/06 0648) Weight:  [66.2 kg] 66.2 kg (01/06 0648)   General:   Alert,  Well-developed, well-nourished, pleasant and cooperative in NAD Head:   Normocephalic and atraumatic. Eyes:  Sclera clear, no icterus.   Conjunctiva pink. Ears:  Normal auditory acuity. Nose:  No deformity, discharge,  or lesions. Msk:  Symmetrical without gross deformities. Normal posture. Extremities:  Without clubbing or edema. Neurologic:  Alert and  oriented x4;  grossly normal neurologically. Skin:  Intact without significant lesions or rashes. Psych:  Alert and cooperative. Normal mood and affect.  Impression/Plan: Proceed with upper endoscopy and colonoscopy   The risks of the procedure including infection, bleed, or perforation as well as benefits, limitations, alternatives and imponderables have been reviewed with the patient. Questions have been  answered. All parties agreeable.

## 2024-12-20 NOTE — Transfer of Care (Signed)
 Immediate Anesthesia Transfer of Care Note  Patient: Zachary Rodriguez  Procedure(s) Performed: COLONOSCOPY EGD (ESOPHAGOGASTRODUODENOSCOPY) POLYPECTOMY, INTESTINE  Patient Location: PACU  Anesthesia Type:MAC  Level of Consciousness: awake  Airway & Oxygen Therapy: Patient Spontanous Breathing and Patient connected to nasal cannula oxygen  Post-op Assessment: Report given to RN, Post -op Vital signs reviewed and stable, and Patient moving all extremities X 4  Post vital signs: Reviewed  Last Vitals:  Vitals Value Taken Time  BP    Temp    Pulse    Resp    SpO2      Last Pain:  Vitals:   12/20/24 0740  TempSrc:   PainSc: 1          Complications: There were no known notable events for this encounter.

## 2024-12-20 NOTE — Discharge Instructions (Signed)

## 2024-12-21 ENCOUNTER — Encounter (HOSPITAL_COMMUNITY): Payer: Self-pay | Admitting: Gastroenterology

## 2024-12-21 LAB — SURGICAL PATHOLOGY

## 2024-12-23 ENCOUNTER — Ambulatory Visit (INDEPENDENT_AMBULATORY_CARE_PROVIDER_SITE_OTHER): Payer: Self-pay | Admitting: Gastroenterology

## 2024-12-24 NOTE — Anesthesia Postprocedure Evaluation (Signed)
"   Anesthesia Post Note  Patient: Zachary Rodriguez  Procedure(s) Performed: COLONOSCOPY EGD (ESOPHAGOGASTRODUODENOSCOPY) POLYPECTOMY, INTESTINE  Patient location during evaluation: Phase II Anesthesia Type: MAC Level of consciousness: awake Pain management: pain level controlled Vital Signs Assessment: post-procedure vital signs reviewed and stable Respiratory status: spontaneous breathing and respiratory function stable Cardiovascular status: blood pressure returned to baseline and stable Postop Assessment: no headache and no apparent nausea or vomiting Anesthetic complications: no Comments: Late entry   There were no known notable events for this encounter.   Last Vitals:  Vitals:   12/20/24 0849 12/20/24 0853  BP: (!) 95/46 105/69  Pulse: 89 89  Resp: (!) 25 20  Temp:    SpO2: 96% 97%    Last Pain:  Vitals:   12/20/24 0853  TempSrc:   PainSc: 0-No pain                 Yvonna PARAS Zonya Gudger      "

## 2024-12-29 NOTE — Progress Notes (Signed)
 1 yr TCS noted in recall Patient result letter mailed Patient's PCP is on EPIC .

## 2025-01-04 ENCOUNTER — Encounter (INDEPENDENT_AMBULATORY_CARE_PROVIDER_SITE_OTHER): Payer: Self-pay

## 2025-03-22 ENCOUNTER — Ambulatory Visit: Admitting: Family Medicine

## 2025-03-22 ENCOUNTER — Ambulatory Visit: Admitting: Nurse Practitioner
